# Patient Record
Sex: Female | Born: 1995 | Race: White | Hispanic: No | Marital: Single | Smoking: Former smoker
Health system: Southern US, Community
[De-identification: ages and names within clinical notes are randomized; demographics above are authoritative.]

## PROBLEM LIST (undated history)

## (undated) ENCOUNTER — Inpatient Hospital Stay: Payer: Self-pay

## (undated) ENCOUNTER — Inpatient Hospital Stay (HOSPITAL_COMMUNITY): Payer: Self-pay

## (undated) DIAGNOSIS — R51 Headache: Secondary | ICD-10-CM

## (undated) DIAGNOSIS — L509 Urticaria, unspecified: Secondary | ICD-10-CM

## (undated) DIAGNOSIS — M1990 Unspecified osteoarthritis, unspecified site: Secondary | ICD-10-CM

## (undated) DIAGNOSIS — F419 Anxiety disorder, unspecified: Secondary | ICD-10-CM

## (undated) DIAGNOSIS — N83209 Unspecified ovarian cyst, unspecified side: Secondary | ICD-10-CM

## (undated) DIAGNOSIS — D649 Anemia, unspecified: Secondary | ICD-10-CM

## (undated) DIAGNOSIS — N809 Endometriosis, unspecified: Secondary | ICD-10-CM

## (undated) DIAGNOSIS — J45909 Unspecified asthma, uncomplicated: Secondary | ICD-10-CM

## (undated) DIAGNOSIS — M419 Scoliosis, unspecified: Secondary | ICD-10-CM

## (undated) HISTORY — PX: TONSILLECTOMY: SUR1361

## (undated) HISTORY — PX: WRIST SURGERY: SHX841

## (undated) HISTORY — PX: OVARIAN CYST REMOVAL: SHX89

---
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
## 2016-01-26 LAB — OB RESULTS CONSOLE GBS: STREP GROUP B AG: POSITIVE

## 2016-02-16 ENCOUNTER — Encounter: Payer: Self-pay | Admitting: *Deleted

## 2016-02-16 ENCOUNTER — Observation Stay
Admission: EM | Admit: 2016-02-16 | Discharge: 2016-02-16 | Disposition: A | Discharge status: Home / Self Care | Payer: Medicaid Other | Attending: Obstetrics and Gynecology | Admitting: Obstetrics and Gynecology

## 2016-02-16 DIAGNOSIS — Z3A39 39 weeks gestation of pregnancy: Secondary | ICD-10-CM | POA: Insufficient documentation

## 2016-02-16 DIAGNOSIS — O471 False labor at or after 37 completed weeks of gestation: Secondary | ICD-10-CM | POA: Diagnosis not present

## 2016-02-16 MED ORDER — FLUCONAZOLE 150 MG PO TABS: 150.0000 mg | ORAL_TABLET | Freq: Every day | ORAL | 0 refills | Status: DC

## 2016-02-16 NOTE — Discharge Summary (Signed)
Pregular rate and rhythmhysician Discharge Summary  Patient ID: Peggy Chandler MRN: [id number] DOB/AGE: 19/06/1996 19 y.o.  Admit date: 02/16/2016 Discharge date: 02/16/2016  Admission Diagnoses: Pt is at 39 weeks with EDD of 02/23/16. She is here with c/o leakage of fluid. She admits positive fetal movement and braxton hicks contractions. She denies VB.   Discharge Diagnoses:  Active Problems:   * No active hospital problems. * No cervical dilation, Membranes intact, Reactive NST, Yeast infection  Discharged Condition: good  Hospital Course: Pt admitted and placed on monitor. Sterile speculum exam for pooling, nitrazine, ferning  Consults: None  Significant Diagnostic Studies: none  Treatments: none  Discharge Exam: Blood pressure 128/82, pulse 70, temperature 98.1 F (36.7 C), temperature source Oral, resp. rate 16, height  (1.702 m), weight 64.4 kg (142 lb), last menstrual period 05/13/2015. General appearance: alert, cooperative, appears stated age and no distress Resp: clear to auscultation bilaterally Cardio: regular rate and rhythm Cervix: closed  Nitrazine negative, Pooling negative, Ferning negative Toco: occasional Fetal Well Being: 125 bpm, moderate variability, +accelerations, -decelerations  Disposition: 01-Home or Self Care  Discharge Instructions    Discharge activity:  No Restrictions    Complete by:  As directed   Discharge diet:  No restrictions    Complete by:  As directed   Fetal Kick Count:  Lie on our left side for one hour after a meal, and count the number of times your baby kicks.  If it is less than 5 times, get up, move around and drink some juice.  Repeat the test 30 minutes later.  If it is still less than 5 kicks in an hour, notify your doctor.    Complete by:  As directed   LABOR:  When conractions begin, you should start to time them from the beginning of one contraction to the beginning  of the next.  When contractions are 5 - 10  minutes apart or less and have been regular for at least an hour, you should call your health care provider.    Complete by:  As directed   No sexual activity restrictions    Complete by:  As directed   Notify physician for bleeding from the vagina    Complete by:  As directed   Notify physician for blurring of vision or spots before the eyes    Complete by:  As directed   Notify physician for chills or fever    Complete by:  As directed   Notify physician for fainting spells, "black outs" or loss of consciousness    Complete by:  As directed   Notify physician for increase in vaginal discharge    Complete by:  As directed   Notify physician for leaking of fluid    Complete by:  As directed   Notify physician for pain or burning when urinating    Complete by:  As directed   Notify physician for pelvic pressure (sudden increase)    Complete by:  As directed   Notify physician for severe or continued nausea or vomiting    Complete by:  As directed   Notify physician for sudden gushing of fluid from the vagina (with or without continued leaking)    Complete by:  As directed   Notify physician for sudden, constant, or occasional abdominal pain    Complete by:  As directed   Notify physician if baby moving less than usual    Complete by:  As directed  Medication List    STOP taking these medications   loratadine 10 MG tablet Commonly known as:  CLARITIN   nitrofurantoin 100 MG capsule Commonly known as:  MACRODANTIN   norgestimate-ethinyl estradiol 0.25-35 MG-MCG tablet Commonly known as:  ORTHO-CYCLEN (28)   predniSONE 10 MG tablet Commonly known as:  DELTASONE   ranitidine 150 MG tablet Commonly known as:  ZANTAC   traMADol 50 MG tablet Commonly known as:  ULTRAM     TAKE these medications   fluconazole 150 MG tablet Commonly known as:  DIFLUCAN Take 1 tablet (150 mg total) by mouth daily.   Prenatal Vitamins 0.8 MG tablet Take 1 tablet by mouth daily.         SignedTresea Mall, CNM

## 2016-02-16 NOTE — Discharge Instructions (Signed)
Come back if:  Big gush of fluids Temp over 100.4 Decreased fetal movement Heavy vaginal bleeding Contractions every 3-5 min lasting at least one hour  Get plenty of rest and drink plenty of fluids!

## 2016-02-21 ENCOUNTER — Inpatient Hospital Stay
Admission: EM | Admit: 2016-02-21 | Discharge: 2016-02-24 | DRG: 775 | Disposition: A | Discharge status: Home / Self Care | Payer: Medicaid Other | Attending: Obstetrics and Gynecology | Admitting: Obstetrics and Gynecology

## 2016-02-21 DIAGNOSIS — D62 Acute posthemorrhagic anemia: Secondary | ICD-10-CM | POA: Diagnosis not present

## 2016-02-21 DIAGNOSIS — O99333 Smoking (tobacco) complicating pregnancy, third trimester: Secondary | ICD-10-CM | POA: Diagnosis present

## 2016-02-21 DIAGNOSIS — D696 Thrombocytopenia, unspecified: Secondary | ICD-10-CM | POA: Diagnosis present

## 2016-02-21 DIAGNOSIS — Z3A39 39 weeks gestation of pregnancy: Secondary | ICD-10-CM | POA: Diagnosis not present

## 2016-02-21 DIAGNOSIS — O9912 Other diseases of the blood and blood-forming organs and certain disorders involving the immune mechanism complicating childbirth: Secondary | ICD-10-CM | POA: Diagnosis present

## 2016-02-21 DIAGNOSIS — O99824 Streptococcus B carrier state complicating childbirth: Secondary | ICD-10-CM | POA: Diagnosis present

## 2016-02-21 DIAGNOSIS — O26893 Other specified pregnancy related conditions, third trimester: Secondary | ICD-10-CM | POA: Diagnosis present

## 2016-02-21 DIAGNOSIS — Z87891 Personal history of nicotine dependence: Secondary | ICD-10-CM

## 2016-02-21 DIAGNOSIS — Z6711 Type A blood, Rh negative: Secondary | ICD-10-CM

## 2016-02-21 DIAGNOSIS — O9902 Anemia complicating childbirth: Secondary | ICD-10-CM | POA: Diagnosis not present

## 2016-02-21 LAB — COMPREHENSIVE METABOLIC PANEL
ALK PHOS: 180 U/L — AB (ref 38–126)
ALT: 9 U/L — ABNORMAL LOW (ref 14–54)
ANION GAP: 10 (ref 5–15)
AST: 24 U/L (ref 15–41)
Albumin: 3.5 g/dL (ref 3.5–5.0)
BUN: 15 mg/dL (ref 6–20)
CALCIUM: 9.1 mg/dL (ref 8.9–10.3)
CO2: 18 mmol/L — AB (ref 22–32)
Chloride: 107 mmol/L (ref 101–111)
Creatinine, Ser: 1.04 mg/dL — ABNORMAL HIGH (ref 0.44–1.00)
Glucose, Bld: 89 mg/dL (ref 65–99)
Potassium: 3.4 mmol/L — ABNORMAL LOW (ref 3.5–5.1)
SODIUM: 135 mmol/L (ref 135–145)
Total Bilirubin: 0.4 mg/dL (ref 0.3–1.2)
Total Protein: 7.4 g/dL (ref 6.5–8.1)

## 2016-02-21 LAB — CBC
HEMATOCRIT: 34.1 % — AB (ref 35.0–47.0)
Hemoglobin: 12 g/dL (ref 12.0–16.0)
MCH: 30.8 pg (ref 26.0–34.0)
MCHC: 35.2 g/dL (ref 32.0–36.0)
MCV: 87.5 fL (ref 80.0–100.0)
PLATELETS: 157 10*3/uL (ref 150–440)
RBC: 3.9 MIL/uL (ref 3.80–5.20)
RDW: 12.7 % (ref 11.5–14.5)
WBC: 18 10*3/uL — AB (ref 3.6–11.0)

## 2016-02-21 LAB — CHLAMYDIA/NGC RT PCR (ARMC ONLY)
CHLAMYDIA TR: NOT DETECTED
N GONORRHOEAE: NOT DETECTED

## 2016-02-21 LAB — TYPE AND SCREEN
ABO/RH(D): A NEG
ANTIBODY SCREEN: NEGATIVE

## 2016-02-21 LAB — OB RESULTS CONSOLE GC/CHLAMYDIA
CHLAMYDIA, DNA PROBE: NEGATIVE
Gonorrhea: NEGATIVE

## 2016-02-21 LAB — PROTEIN / CREATININE RATIO, URINE
CREATININE, URINE: 162 mg/dL
PROTEIN CREATININE RATIO: 0.14 mg/mg{creat} (ref 0.00–0.15)
Total Protein, Urine: 22 mg/dL

## 2016-02-21 MED ORDER — SOD CITRATE-CITRIC ACID 500-334 MG/5ML PO SOLN: 30.0000 mL | ORAL | Status: DC | PRN

## 2016-02-21 MED ORDER — MISOPROSTOL 200 MCG PO TABS
ORAL_TABLET | ORAL | Status: AC
  Filled 2016-02-21: qty 4

## 2016-02-21 MED ORDER — LIDOCAINE HCL (PF) 1 % IJ SOLN
INTRAMUSCULAR | Status: AC
  Filled 2016-02-21: qty 30

## 2016-02-21 MED ORDER — DEXTROSE 5 % IV SOLN
2.5000 10*6.[IU] | INTRAVENOUS | Status: DC
  Filled 2016-02-21 (×4): qty 2.5

## 2016-02-21 MED ORDER — LACTATED RINGERS IV SOLN: 500.0000 mL | INTRAVENOUS | Status: DC | PRN

## 2016-02-21 MED ORDER — ONDANSETRON HCL 4 MG/2ML IJ SOLN: 4.0000 mg | Freq: Four times a day (QID) | INTRAMUSCULAR | Status: DC | PRN

## 2016-02-21 MED ORDER — OXYTOCIN 40 UNITS IN LACTATED RINGERS INFUSION - SIMPLE MED
2.5000 [IU]/h | INTRAVENOUS | Status: DC
  Administered 2016-02-21: 2.5 [IU]/h via INTRAVENOUS
  Filled 2016-02-21: qty 1000

## 2016-02-21 MED ORDER — LACTATED RINGERS IV SOLN
INTRAVENOUS | Status: DC
  Administered 2016-02-21: 22:00:00 via INTRAVENOUS

## 2016-02-21 MED ORDER — LIDOCAINE HCL (PF) 1 % IJ SOLN: 30.0000 mL | INTRAMUSCULAR | Status: DC | PRN

## 2016-02-21 MED ORDER — PENICILLIN G POTASSIUM 5000000 UNITS IJ SOLR
5.0000 10*6.[IU] | Freq: Once | INTRAVENOUS | Status: DC
  Filled 2016-02-21: qty 5

## 2016-02-21 MED ORDER — OXYTOCIN BOLUS FROM INFUSION: 500.0000 mL | Freq: Once | INTRAVENOUS | Status: DC

## 2016-02-21 MED ORDER — AMMONIA AROMATIC IN INHA
RESPIRATORY_TRACT | Status: AC
  Filled 2016-02-21: qty 10

## 2016-02-21 MED ORDER — OXYTOCIN 10 UNIT/ML IJ SOLN
INTRAMUSCULAR | Status: AC
  Filled 2016-02-21: qty 2

## 2016-02-21 MED ORDER — OXYTOCIN 10 UNIT/ML IJ SOLN: 10.0000 [IU] | Freq: Once | INTRAMUSCULAR | Status: DC

## 2016-02-21 NOTE — H&P (Signed)
OB History & Physical   History of Present Illness:  Chief Complaint: contractions  HPI:  Peggy Chandler is a 20 y.o. G44P0020 female at [redacted]w[redacted]d dated by 7 week ultrasound.  Her pregnancy has been complicated by Rh negative status, tobacco abuse (quit smoking in July 2017), thrombocytopenia, psychiatric admission during pregnancy (at [facility] in 11/2015 for anxiet/OCD/agoraphobia/), chlamydia at new ob visit..    She reports contractions.   She denies leakage of fluid.   She denies vaginal bleeding.   She reports fetal movement.    Maternal Medical History:   Past Medical History:  Diagnosis Date  . Anemia   . Anxiety   . Arthritis   . Asthma   . Endometriosis   . Headache(784.0)   . Hives   . Ovarian cyst   . Scoliosis     Past Surgical History:  Procedure Laterality Date  . OVARIAN CYST REMOVAL    . TONSILLECTOMY      Allergies  Allergen Reactions  . Asa [Aspirin] Other (See Comments)    Syncope   . Asa [Aspirin] Hives    Prior to Admission medications   Medication Sig Start Date End Date Taking? Authorizing Provider  Prenatal Multivit-Min-Fe-FA (PRENATAL VITAMINS) 0.8 MG tablet Take 1 tablet by mouth daily. 06/14/15  Yes Sharman Cheek, MD  fluconazole (DIFLUCAN) 150 MG tablet Take 1 tablet (150 mg total) by mouth daily. Patient not taking: Reported on 02/21/2016 02/16/16   Tresea Mall, CNM    OB History  Gravida Para Term Preterm AB Living  3 0 0 0 2    SAB TAB Ectopic Multiple Live Births  1 1 0        # Outcome Date GA Lbr Len/2nd Weight Sex Delivery Anes PTL Lv  3 Current           2 SAB           1 TAB              Birth Comments: System Generated. Please review and update pregnancy details.      Prenatal care site: [facility]  Social History: She  reports that she quit smoking about 2 months ago. Her smoking use included Cigarettes. She smoked 1.00 pack per day. She has never used smokeless tobacco. She reports that she does not drink  alcohol or use drugs.  Family History: family history includes Anxiety disorder in her mother; Bipolar disorder in her sister.   Review of Systems: Negative x 10 systems reviewed except as noted in the HPI.    Physical Exam:  Vital Signs: BP (!) 134/91 (BP Location: Left Arm)   Pulse 73   Temp 98.1 F (36.7 C) (Oral)   Resp 18   Ht 5\' 7"  (1.702 m)   Wt 64.4 kg (142 lb)   LMP 05/13/2015   BMI 22.24 kg/m  General: no acute distress.  HEENT: normocephalic, atraumatic Heart: regular rate & rhythm.  No murmurs/rubs/gallops Lungs: clear to auscultation bilaterally Abdomen: soft, gravid, non-tender;  EFW 6.5 pounds Pelvic: (female chaperone present during pelvic exam)  External: Normal external female genitalia  Cervix: 6cm at admission (3.5cm 2 hours prior)  Extremities: non-tender, symmetric, no edema bilaterally.  DTRs: 2+  Neurologic: Alert & oriented x 3.    Pertinent Results:  Prenatal Labs: Blood type/Rh A negative  Antibody screen negative  Rubella Immune  Varicella Immune    RPR NR  HBsAg negative  HIV negative  GC negative  Chlamydia Positive (  treated with negative TOC at 36 weeks)  Genetic screening First trim neg, msAFP neg  1 hour GTT Unable to find  3 hour GTT n/a  GBS positive   Baseline FHR: 125 beats/min   Variability: moderate   Accelerations: present Decelerations: absent Contractions: present frequency: 3-4 q 10 min Overall assessment: category 1  Assessment:  Peggy Chandler is a 20 y.o. G33P0020 female at [redacted]w[redacted]d with active labor.   Plan:  1. Admit to Labor & Delivery  2. CBC, T&S, Clrs, IVF 3. GBS positive.   4. Fetwal well-being: reassuring   Conard Novak, MD 02/21/2016 11:13 PM

## 2016-02-21 NOTE — OB Triage Note (Signed)
Patient came in for observation for labor evaluation. Patient reports contractions every four to five minutes that started this morning and have progressively gotten closer together and more intense. Patient rates pain 8 out of 10. Patient denies leaking of fluid but complains of Sharmon Cheramie discharge from a untreated yeast infection. Patinet denies vaginal bleeding and spotting. Patient has elevated blood pressures 140s/90s, but patient afebrile. FHR baseline 125 with moderate variability with accelerations 15 x 15 and no decelerations. Significant other at bedside. Will continue to monitor.

## 2016-02-22 LAB — FETAL SCREEN: FETAL SCREEN: NEGATIVE

## 2016-02-22 LAB — CBC
HEMATOCRIT: 28.9 % — AB (ref 35.0–47.0)
HEMOGLOBIN: 10.4 g/dL — AB (ref 12.0–16.0)
MCH: 31.3 pg (ref 26.0–34.0)
MCHC: 35.9 g/dL (ref 32.0–36.0)
MCV: 87.2 fL (ref 80.0–100.0)
Platelets: 113 10*3/uL — ABNORMAL LOW (ref 150–440)
RBC: 3.31 MIL/uL — AB (ref 3.80–5.20)
RDW: 13 % (ref 11.5–14.5)
WBC: 20.2 10*3/uL — ABNORMAL HIGH (ref 3.6–11.0)

## 2016-02-22 MED ORDER — ONDANSETRON HCL 4 MG PO TABS: 4.0000 mg | ORAL_TABLET | ORAL | Status: DC | PRN

## 2016-02-22 MED ORDER — WITCH HAZEL-GLYCERIN EX PADS: 1.0000 "application " | MEDICATED_PAD | CUTANEOUS | Status: DC | PRN

## 2016-02-22 MED ORDER — DIBUCAINE 1 % RE OINT: 1.0000 "application " | TOPICAL_OINTMENT | RECTAL | Status: DC | PRN

## 2016-02-22 MED ORDER — HYDROCODONE-ACETAMINOPHEN 5-325 MG PO TABS: 1.0000 | ORAL_TABLET | Freq: Four times a day (QID) | ORAL | Status: DC | PRN

## 2016-02-22 MED ORDER — PRENATAL MULTIVITAMIN CH
1.0000 | ORAL_TABLET | Freq: Every day | ORAL | Status: DC
  Administered 2016-02-22 – 2016-02-24 (×3): 1 via ORAL
  Filled 2016-02-22 (×3): qty 1

## 2016-02-22 MED ORDER — COCONUT OIL OIL: 1.0000 "application " | TOPICAL_OIL | Status: DC | PRN

## 2016-02-22 MED ORDER — DIPHENHYDRAMINE HCL 25 MG PO CAPS: 25.0000 mg | ORAL_CAPSULE | Freq: Four times a day (QID) | ORAL | Status: DC | PRN

## 2016-02-22 MED ORDER — HYDROCODONE-ACETAMINOPHEN 5-325 MG PO TABS: 2.0000 | ORAL_TABLET | Freq: Four times a day (QID) | ORAL | Status: DC | PRN

## 2016-02-22 MED ORDER — FERROUS SULFATE 325 (65 FE) MG PO TABS
325.0000 mg | ORAL_TABLET | Freq: Two times a day (BID) | ORAL | Status: DC
  Administered 2016-02-22 – 2016-02-24 (×5): 325 mg via ORAL
  Filled 2016-02-22 (×5): qty 1

## 2016-02-22 MED ORDER — ACETAMINOPHEN 325 MG PO TABS: 650.0000 mg | ORAL_TABLET | ORAL | Status: DC | PRN

## 2016-02-22 MED ORDER — SENNOSIDES-DOCUSATE SODIUM 8.6-50 MG PO TABS
2.0000 | ORAL_TABLET | ORAL | Status: DC
  Administered 2016-02-23: 2 via ORAL
  Filled 2016-02-22 (×2): qty 2

## 2016-02-22 MED ORDER — RHO D IMMUNE GLOBULIN 1500 UNIT/2ML IJ SOSY
300.0000 ug | PREFILLED_SYRINGE | Freq: Once | INTRAMUSCULAR | Status: AC
  Administered 2016-02-22: 300 ug via INTRAVENOUS
  Filled 2016-02-22: qty 2

## 2016-02-22 MED ORDER — BENZOCAINE-MENTHOL 20-0.5 % EX AERO: 1.0000 "application " | INHALATION_SPRAY | CUTANEOUS | Status: DC | PRN

## 2016-02-22 MED ORDER — SIMETHICONE 80 MG PO CHEW: 80.0000 mg | CHEWABLE_TABLET | ORAL | Status: DC | PRN

## 2016-02-22 MED ORDER — ONDANSETRON HCL 4 MG/2ML IJ SOLN: 4.0000 mg | INTRAMUSCULAR | Status: DC | PRN

## 2016-02-22 NOTE — Discharge Summary (Signed)
OB Discharge Summary     Patient Name: Peggy Chandler DOB: [birth year] MRN: [id number]  Date of admission: 02/21/2016 Delivering MD: Conard Novak, MD  Date of Delivery: 02/22/2016  Date of discharge: 02/24/2016  Admitting diagnosis: 39 weeks contractions Intrauterine pregnancy: [redacted]w[redacted]d     Secondary diagnosis: None     Discharge diagnosis: Term Pregnancy Delivered                                                                                                Post partum procedures:rhogam  Augmentation: none  Complications: None  Hospital course:  Onset of Labor With Vaginal Delivery     20 y.o. yo G3P0020 at [redacted]w[redacted]d was admitted in Active Labor on 02/21/2016. Patient had an uncomplicated labor course as follows:  Membrane Rupture Time/Date: 11:37 PM ,02/21/2016   Intrapartum Procedures: Episiotomy: None [1]                                         Lacerations:  None [1]  Patient had a delivery of a Viable 6#10.2oz female infant. 02/21/2016  Information for the patient's newborn:  Mkayla, Steele [[id number]]  Delivery Method: Vag-Spont    Pateint had an uncomplicated postpartum course.  She is ambulating, tolerating a regular diet, passing flatus, and urinating well. Patient is discharged home in stable condition on 02/24/2016.    Physical exam  Vitals:   02/23/16 1640 02/23/16 1943 02/24/16 0740 02/24/16 1111  BP: 113/73 115/75 117/68   Pulse: 64 69 66   Resp: Temp: 98.5 F (36.9 C) 98.3 F (36.8 C) 98.6 F (37 C) 98.2 F (36.8 C)  TempSrc: Oral Oral Oral Oral  SpO2: 100% 100% 99%   Weight:      Height:       General: alert, cooperative and no distress Lochia: appropriate Uterine Fundus: firm at U-1/ML/NT DVT Evaluation: No evidence of DVT seen on physical exam. Breasts: soft  Labs: Lab Results  Component Value Date   WBC 20.2 (H) 02/22/2016   HGB 10.4 (L) 02/22/2016   HCT 28.9 (L) 02/22/2016   MCV 87.2 02/22/2016   PLT 113 (L) 02/22/2016    CMP Latest Ref Rng & Units 02/21/2016  Glucose 65 - 99 mg/dL 89  BUN 6 - 20 mg/dL 15  Creatinine 0.86 - 5.78 mg/dL 4.69(G)  Sodium 295 - 284 mmol/L 135  Potassium 3.5 - 5.1 mmol/L 3.4(L)  Chloride 101 - 111 mmol/L 107  CO2 22 - 32 mmol/L 18(L)  Calcium 8.9 - 10.3 mg/dL 9.1  Total Protein 6.5 - 8.1 g/dL 7.4  Total Bilirubin 0.3 - 1.2 mg/dL 0.4  Alkaline Phos 38 - 126 U/L 180(H)  AST 15 - 41 U/L 24  ALT 14 - 54 U/L 9(L)    Discharge instruction: per After Visit Summary.  Medications:    Medication List    STOP taking these medications   fluconazole 150 MG tablet Commonly known as:  DIFLUCAN  TAKE these medications   Ferrous Sulfate 142 (45 Fe) MG Tbcr Commonly known as:  SLOW FE Take 1 capsule by mouth daily.   Prenatal Vitamins 0.8 MG tablet Take 1 tablet by mouth daily.      Diet: routine diet  Activity: Advance as tolerated. Pelvic rest for 6 weeks.   Outpatient follow up: Follow-up Information    Conard NovakJackson, Stephen D, MD. Schedule an appointment as soon as possible for a visit in 2 week(s).   Specialty:  Obstetrics and Gynecology Why:  pp depression check in 1-2 weeks; please call and make this appointment Also schedule postpartum appointment in 6 weeks. Contact information: [address] [place] [address] [phone]             Postpartum contraception: IUD, unsure which one Rhogam Given postpartum: yes Rubella vaccine given postpartum: no Varicella vaccine given postpartum: no TDaP given antepartum or postpartum: AP  Newborn Data: Live born female/ [place] Birth Weight: 6 lb 10.2 oz (3010 g) APGAR: 8, 9  Baby Feeding: Bottle  Disposition:home with mother  SIGNED: Farrel ConnersGUTIERREZ, Rozina Pointer, CNM

## 2016-02-22 NOTE — Progress Notes (Signed)
Patient ID: Peggy Chandler, female   DOB: [birth year], 20 y.o.   MRN: [id number] Obstetric Postpartum Daily Progress Note Subjective:  20 y.o. [id number] postpartum day #1 status post vaginal delivery.  She is ambulating, is tolerating po, is voiding spontaneously.  Her pain is well controlled on PO pain medications. Her lochia is less than menses.   Medications SCHEDULED MEDICATIONS  . ferrous sulfate  325 mg Oral BID WC  . prenatal multivitamin  1 tablet Oral Q1200  . [START ON 02/23/2016] senna-docusate  2 tablet Oral Q24H    MEDICATION INFUSIONS     PRN MEDICATIONS  acetaminophen, benzocaine-Menthol, coconut oil, witch hazel-glycerin **AND** dibucaine, diphenhydrAMINE, HYDROcodone-acetaminophen **OR** HYDROcodone-acetaminophen, ondansetron **OR** ondansetron (ZOFRAN) IV, simethicone    Objective:   Vitals:   02/22/16 0152 02/22/16 0211 02/22/16 0724 02/22/16 1119  BP: (!) 148/85 127/73 125/74 107/79  Pulse: 91 (!) 58 67 68  Resp:  18 18 18   Temp:  98.7 F (37.1 C) 98.4 F (36.9 C) 98.7 F (37.1 C)  TempSrc:  Oral Oral Oral  SpO2:   99%   Weight:      Height:        Current Vital Signs 24h Vital Sign Ranges  T 98.7 F (37.1 C) Temp  Avg: 98.4 F (36.9 C)  Min: 97.8 F (36.6 C)  Max: 98.7 F (37.1 C)  BP 107/79 BP  Min: 107/79  Max: 148/85  HR 68 Pulse  Avg: 76.1  Min: 56  Max: 133  RR 18 Resp  Avg: 18  Min: 18  Max: 18  SaO2 99 % Not Delivered SpO2  Avg: 99 %  Min: 99 %  Max: 99 %       24 Hour I/O Current Shift I/O  Time Ins Outs 08/05 0701 - 08/06 0700 In: -  Out: 450  08/06 0701 - 08/06 1900 In: -  Out: 600 [Urine:600]  General: NAD Pulmonary: no increased work of breathing Abdomen: non-distended, non-tender, fundus firm at level of umbilicus Extremities: no edema, no erythema, no tenderness  Labs:   Recent Labs Lab 02/21/16 2203 02/22/16 0536  WBC 18.0* 20.2*  HGB 12.0 10.4*  HCT 34.1* 28.9*  PLT 157 113*     Assessment:   20 y.o.  [id number] postpartum day # 1 status post SVD, doing well  Plan:   1) Acute blood loss anemia - hemodynamically stable and asymptomatic - po ferrous sulfate  2) A NEG / Rubella Immune (12/20 0000)/ Varicella Immune  3) TDAP status Unknown  4) bottle feeding /Contraception = undecided  5) Disposition: home PPD#2.   Thomasene MohairStephen Jackson, MD 02/22/2016 12:03 PM

## 2016-02-23 LAB — RHOGAM INJECTION: UNIT DIVISION: 0

## 2016-02-23 LAB — RPR: RPR Ser Ql: NONREACTIVE

## 2016-02-23 NOTE — Progress Notes (Signed)
   02/23/16 0900  Clinical Encounter Type  Visited With Patient and family together  Visit Type Initial  Consult/Referral To Chaplain  Pt appears to be in good spirits.

## 2016-02-23 NOTE — Progress Notes (Addendum)
PP Note  Subjective: no complaints, up ad lib, voiding and tolerating PO  Objective: Blood pressure 120/88, pulse (!) 53, temperature 98 F (36.7 C), temperature source Oral, resp. rate 18, height 5\' 7"  (1.702 m), weight 142 lb (64.4 kg), last menstrual period 05/13/2015, SpO2 100 %,  Physical Exam:  General: alert and cooperative Lochia: appropriate Uterine Fundus: firm Incision: N/A DVT Evaluation: No evidence of DVT seen on physical exam. Abdomen: soft, NT   Recent Labs  02/21/16 2203 02/22/16 0536  HGB 12.0 10.4*  HCT 34.1* 28.9*    Assessment PPD 1 (24 hours at midnight this am), acute blood loss anemia  Plan: Discharge tomorrow, Continue PP care, Advance activity as tolerated and Fe replacement, anemia precautions  Baby needing 48 hours observation d/t inadequate GBS treatment  Feeding: bottle Contraception: undecided Blood Type: A neg/baby AB positive: Rhogam given RI/VI TDAP UTD    Peggy Chandler, Peggy Chandler, CNM 02/23/2016, 9:55 AM

## 2016-02-24 MED ORDER — FERROUS SULFATE ER 142 (45 FE) MG PO TBCR: 1.0000 | EXTENDED_RELEASE_TABLET | Freq: Every day | ORAL | Status: DC

## 2016-02-24 NOTE — Discharge Instructions (Signed)
Vaginal Delivery, Care After Refer to this sheet in the next few weeks. These discharge instructions provide you with information on caring for yourself after delivery. Your caregiver may also give you specific instructions. Your treatment has been planned according to the most current medical practices available, but problems sometimes occur. Call your caregiver if you have any problems or questions after you go home. HOME CARE INSTRUCTIONS 1. Take over-the-counter or prescription medicines only as directed by your caregiver or pharmacist. 2. Do not drink alcohol, especially if you are breastfeeding or taking medicine to relieve pain. 3. Do not smoke tobacco. 4. Continue to use good perineal care. Good perineal care includes: 1. Wiping your perineum from back to front 2. Keeping your perineum clean. 3. You can do sitz baths twice a day, to help keep this area clean 5. Do not use tampons, douche or have sex until your caregiver says it is okay. 6. Shower only and avoid sitting in submerged water, aside from sitz baths 7. Wear a well-fitting bra that provides breast support. 8. Eat healthy foods. 9. Drink enough fluids to keep your urine clear or pale yellow. 10. Eat high-fiber foods such as whole grain cereals and breads, brown rice, beans, and fresh fruits and vegetables every day. These foods may help prevent or relieve constipation. 11. Avoid constipation with high fiber foods or medications, such as miralax or metamucil 12. Follow your caregiver's recommendations regarding resumption of activities such as climbing stairs, driving, lifting, exercising, or traveling. 13. Talk to your caregiver about resuming sexual activities. Resumption of sexual activities is dependent upon your risk of infection, your rate of healing, and your comfort and desire to resume sexual activity. 14. Try to have someone help you with your household activities and your newborn for at least a few days after you leave  the hospital. 15. Rest as much as possible. Try to rest or take a nap when your newborn is sleeping. 16. Increase your activities gradually. 17. Keep all of your scheduled postpartum appointments. It is very important to keep your scheduled follow-up appointments. At these appointments, your caregiver will be checking to make sure that you are healing physically and emotionally. SEEK MEDICAL CARE IF:   You are passing large clots from your vagina. Save any clots to show your caregiver.  You have a foul smelling discharge from your vagina.  You have trouble urinating.  You are urinating frequently.  You have pain when you urinate.  You have a change in your bowel movements.  You have increasing redness, pain, or swelling near your vaginal incision (episiotomy) or vaginal tear.  You have pus draining from your episiotomy or vaginal tear.  Your episiotomy or vaginal tear is separating.  You have painful, hard, or reddened breasts.  You have a severe headache.  You have blurred vision or see spots.  You feel sad or depressed.  You have thoughts of hurting yourself or your newborn.  You have questions about your care, the care of your newborn, or medicines.  You are dizzy or light-headed.  You have a rash.  You have nausea or vomiting.  You were breastfeeding and have not had a menstrual period within 12 weeks after you stopped breastfeeding.  You are not breastfeeding and have not had a menstrual period by the 12th week after delivery.  You have a fever. SEEK IMMEDIATE MEDICAL CARE IF:   You have persistent pain.  You have chest pain.  You have shortness of breath.    You faint.  You have leg pain.  You have stomach pain.  Your vaginal bleeding saturates two or more sanitary pads in 1 hour. MAKE SURE YOU:   Understand these instructions.  Will watch your condition.  Will get help right away if you are not doing well or get worse. Document Released:  07/02/2000 Document Revised: 11/19/2013 Document Reviewed: 03/01/2012 [facility] Patient Information 2015 [place], [place]. This information is not intended to replace advice given to you by your health care provider. Make sure you discuss any questions you have with your health care provider.  Sitz Bath A sitz bath is a warm water bath taken in the sitting position. The water covers only the hips and butt (buttocks). We recommend using one that fits in the toilet, to help with ease of use and cleanliness. It may be used for either healing or cleaning purposes. Sitz baths are also used to relieve pain, itching, or muscle tightening (spasms). The water may contain medicine. Moist heat will help you heal and relax.  HOME CARE  Take 3 to 4 sitz baths a day. 18. Fill the bathtub half-full with warm water. 19. Sit in the water and open the drain a little. 20. Turn on the warm water to keep the tub half-full. Keep the water running constantly. 21. Soak in the water for 15 to 20 minutes. 22. After the sitz bath, pat the affected area dry. GET HELP RIGHT AWAY IF: You get worse instead of better. Stop the sitz baths if you get worse. MAKE SURE YOU:  Understand these instructions.  Will watch your condition.  Will get help right away if you are not doing well or get worse. Document Released: 08/12/2004 Document Revised: 03/29/2012 Document Reviewed: 11/02/2010 [facility] Patient Information 2015 [place], [place]. This information is not intended to replace advice given to you by your health care provider. Make sure you discuss any questions you have with your health care provider.  Call your doctor for increased pain or vaginal bleeding, temperature above 100.4, depression, or concerns.  No strenuous activity or heavy lifting for 6 weeks.  No intercourse, tampons, douching, or enemas for 6 weeks.  No tub baths-showers only.  No driving for 2 weeks or while taking pain medications.  Continue prenatal vitamin  and iron.

## 2016-02-24 NOTE — Progress Notes (Signed)
Prenatal records indicate that pt received TDaP in May, 2017. Reynold BowenSusan Paisley Wylee Dorantes, RN 02/24/2016 11:56 AM

## 2016-02-24 NOTE — Progress Notes (Signed)
Discharge instructions provided.  Pt and sig other verbalize understanding of all instructions and follow-up care.  Prescription given.  Pt discharged to home with infant at 1500 on 02/24/16 via wheelchair by volunteer. Reynold BowenSusan Paisley Khristen Cheyney, RN 02/24/2016 4:00 PM

## 2016-09-29 ENCOUNTER — Ambulatory Visit: Payer: Self-pay | Admitting: Obstetrics & Gynecology

## 2016-12-16 ENCOUNTER — Emergency Department: Payer: Self-pay

## 2016-12-16 ENCOUNTER — Encounter: Payer: Self-pay | Admitting: Emergency Medicine

## 2016-12-16 ENCOUNTER — Emergency Department
Admission: EM | Admit: 2016-12-16 | Discharge: 2016-12-16 | Disposition: A | Discharge status: Home / Self Care | Payer: Self-pay | Attending: Emergency Medicine | Admitting: Emergency Medicine

## 2016-12-16 DIAGNOSIS — F17210 Nicotine dependence, cigarettes, uncomplicated: Secondary | ICD-10-CM | POA: Insufficient documentation

## 2016-12-16 DIAGNOSIS — R05 Cough: Secondary | ICD-10-CM | POA: Insufficient documentation

## 2016-12-16 DIAGNOSIS — R059 Cough, unspecified: Secondary | ICD-10-CM

## 2016-12-16 DIAGNOSIS — J45909 Unspecified asthma, uncomplicated: Secondary | ICD-10-CM | POA: Insufficient documentation

## 2016-12-16 MED ORDER — PSEUDOEPH-BROMPHEN-DM 30-2-10 MG/5ML PO SYRP: 5.0000 mL | ORAL_SOLUTION | Freq: Four times a day (QID) | ORAL | 0 refills | Status: DC | PRN
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
## 2016-12-16 NOTE — ED Notes (Signed)
Pt discharged home after verbalizing understanding of discharge instructions; nad noted. 
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
## 2016-12-25 IMAGING — US US PELV - US TRANSVAGINAL
1 series · 13 of 25 positions shown · non-contrast
Comparison: Pelvic ultrasound 05/03/2013

CLINICAL DATA: Pelvic pain for 3-4 days. History of endometriosis.
History of left ovarian cyst removal 3100.

EXAM:
TRANSABDOMINAL AND TRANSVAGINAL ULTRASOUND OF PELVIS
TECHNIQUE: Both transabdominal and transvaginal ultrasound examinations of the
pelvis were performed. Transabdominal technique was performed for
global imaging of the pelvis including uterus, ovaries, adnexal
regions, and pelvic cul-de-sac. It was necessary to proceed with
endovaginal exam following the transabdominal exam to visualize the
uterus, endometrium and right ovary.

[Series 1: us pelv - us transvaginal · 0.20mm/px · 13 of 44 slices shown]
[im 1/44]
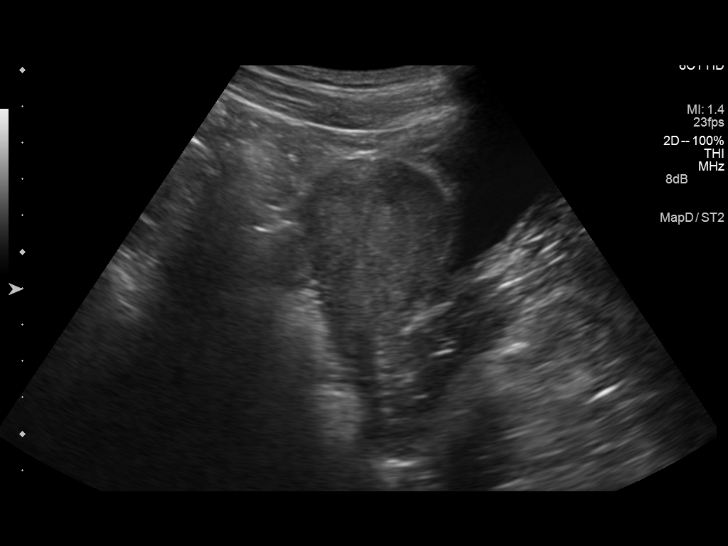
[im 4/44]
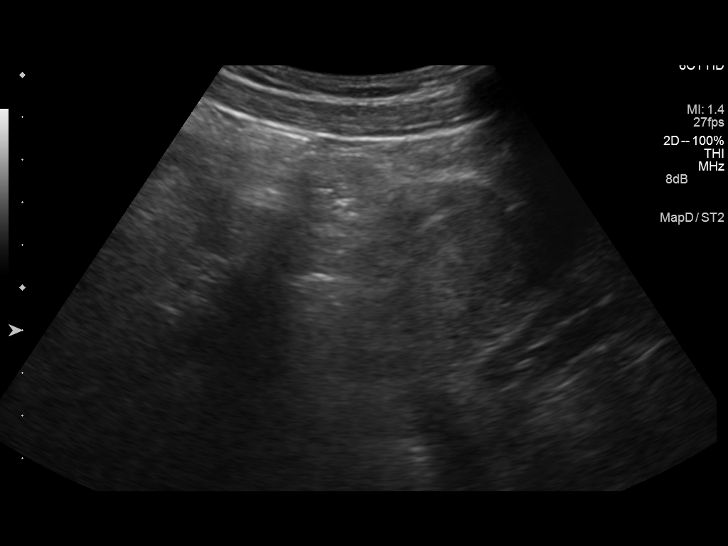
[im 8/44]
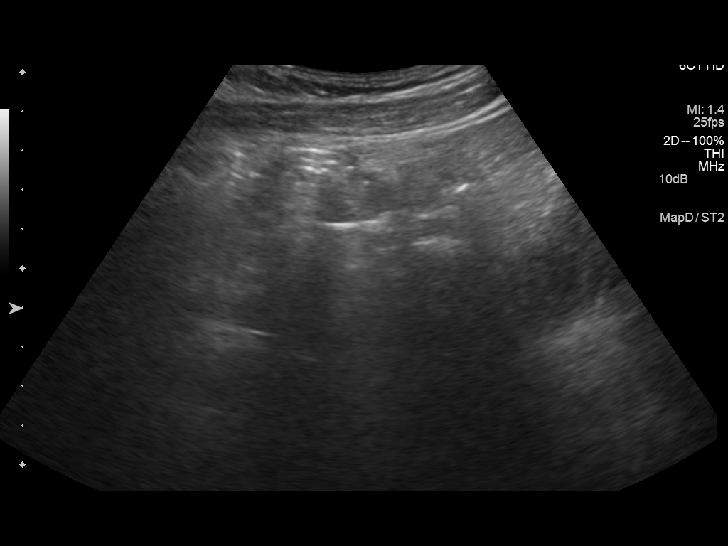
[im 11/44]
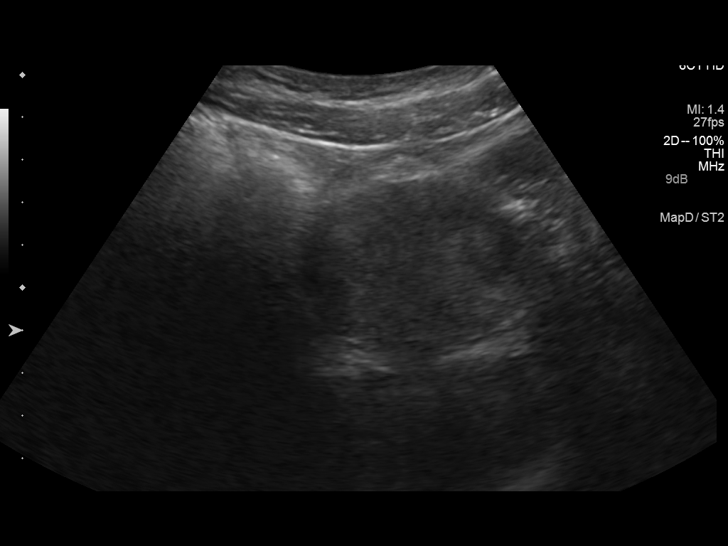
[im 15/44]
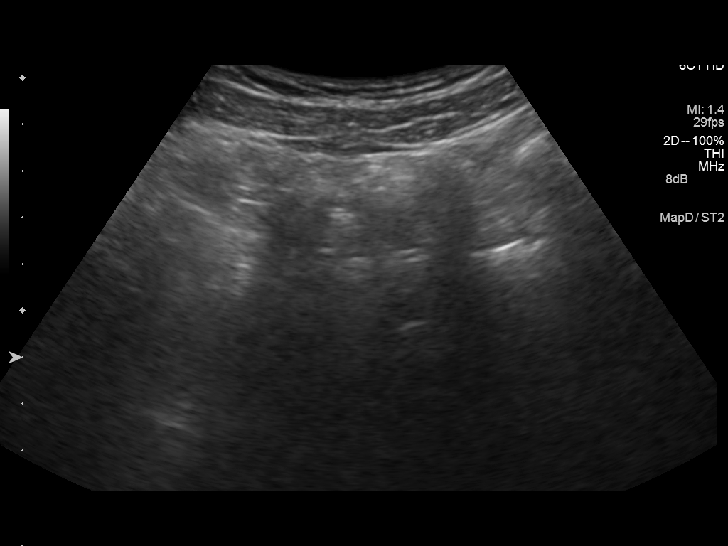
[im 18/44]
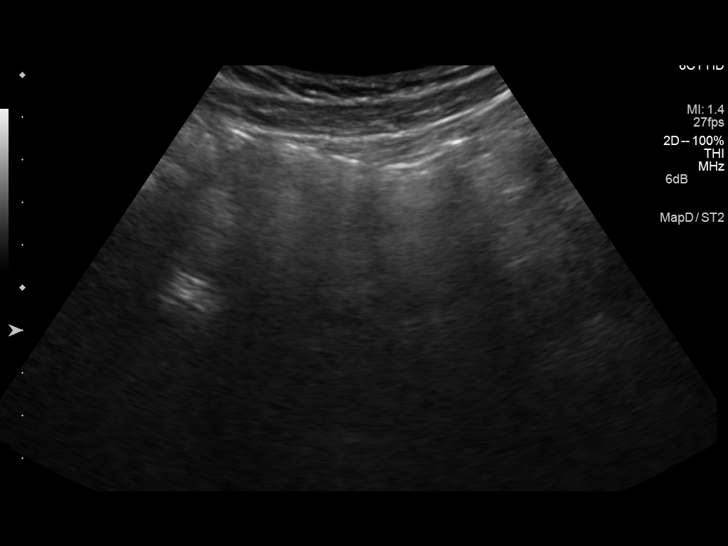
[im 22/44]
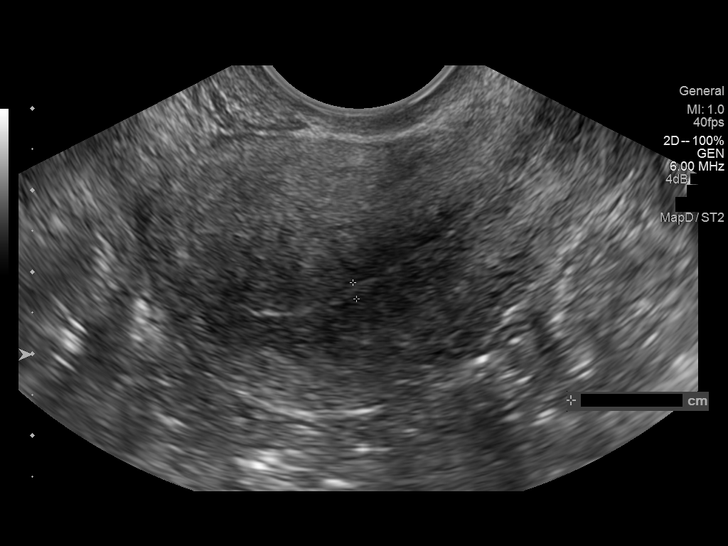
[im 26/44]
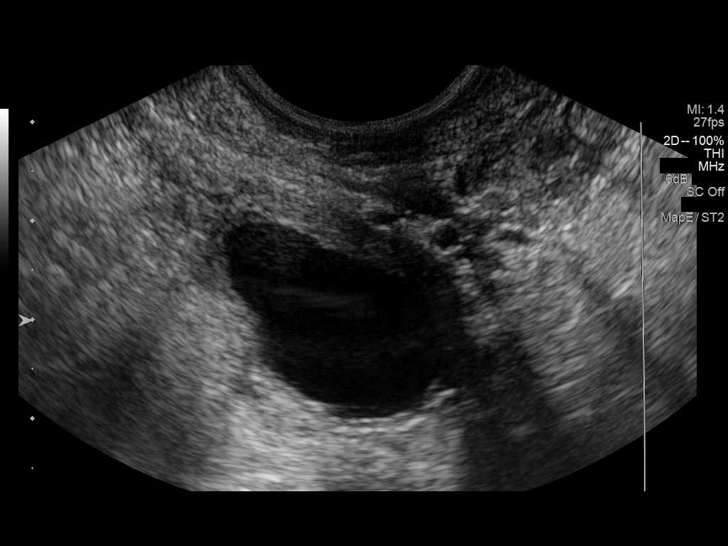
[im 29/44]
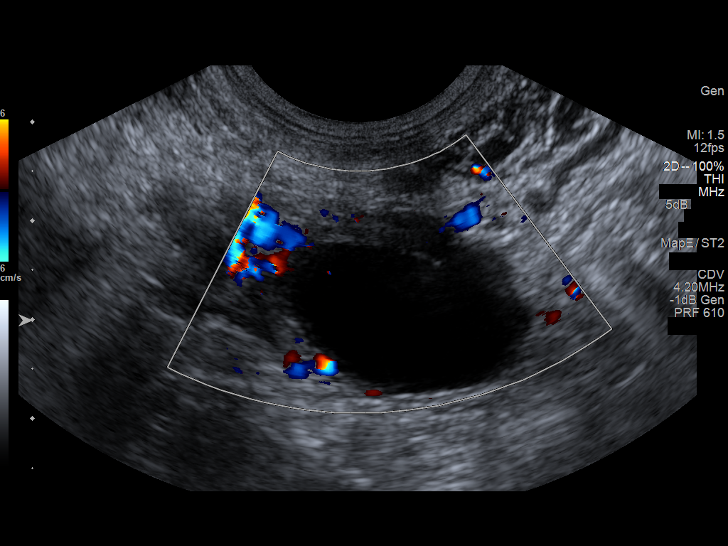
[im 33/44]
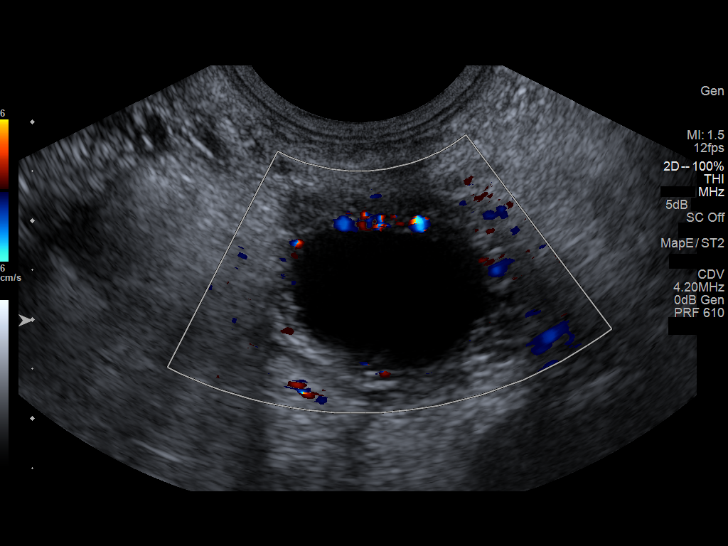
[im 36/44]
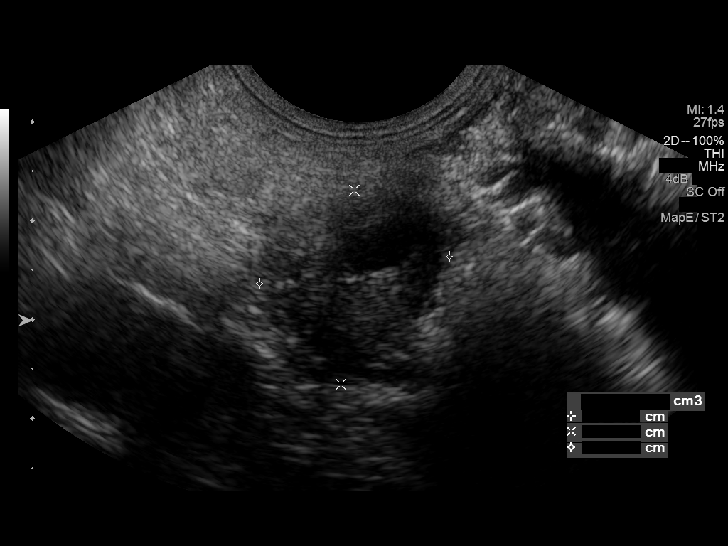
[im 40/44]
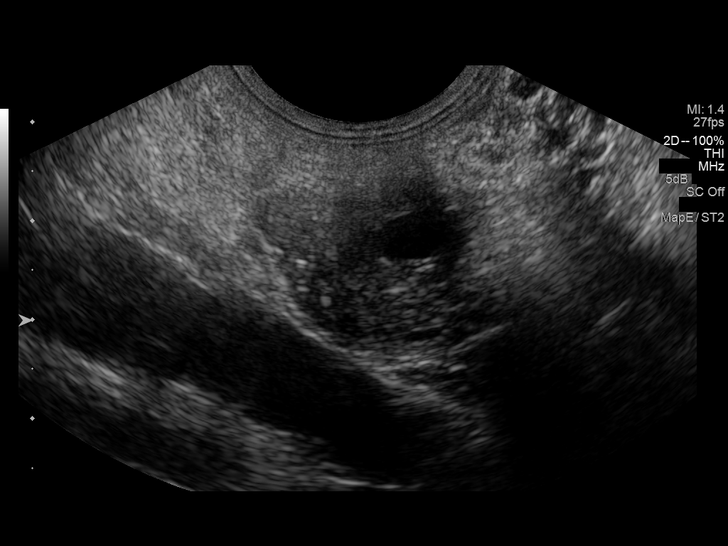
[im 44/44]
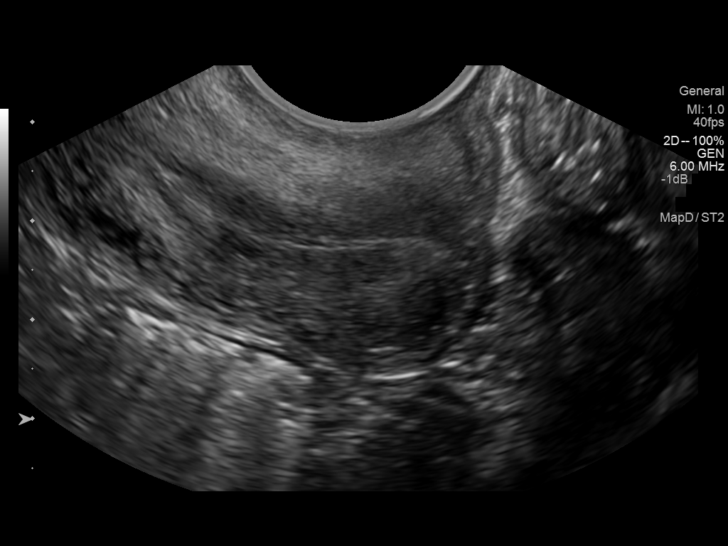

[13 of 25 positions shown; findings below may reference images not displayed]

FINDINGS: Uterus

Measurements: 6.4 x 4.3 x 4.0 cm. No fibroids or other mass
visualized.

Endometrium

Thickness: 2 mm.  No focal abnormality visualized.

Right ovary

Measurements: 2.4 x 2.0 x 1.9 cm. Normal appearance/no adnexal mass.
Normal blood flow.

Left ovary

Measurements: 3.0 x 2.3 x 2.6 cm. There is a simple cyst in the left
ovary measuring 2.5 x 1.5 x 2.2 cm. Normal blood flow seen to the
adjacent ovarian parenchyma.

Other findings

No free fluid.
IMPRESSION: 1. Simple cyst in the left ovary measures 2.5 cm. Normal blood flow
seen.
2. Normal appearance of right ovary and uterus.

## 2017-02-02 ENCOUNTER — Encounter: Payer: Self-pay | Admitting: Emergency Medicine

## 2017-02-02 ENCOUNTER — Emergency Department
Admission: EM | Admit: 2017-02-02 | Discharge: 2017-02-02 | Disposition: A | Discharge status: Home / Self Care | Payer: Medicaid Other | Attending: Emergency Medicine | Admitting: Emergency Medicine

## 2017-02-02 ENCOUNTER — Telehealth: Payer: Self-pay | Admitting: Emergency Medicine

## 2017-02-02 DIAGNOSIS — Z5321 Procedure and treatment not carried out due to patient leaving prior to being seen by health care provider: Secondary | ICD-10-CM | POA: Insufficient documentation

## 2017-02-02 DIAGNOSIS — R109 Unspecified abdominal pain: Secondary | ICD-10-CM | POA: Insufficient documentation

## 2017-02-02 LAB — COMPREHENSIVE METABOLIC PANEL
ALBUMIN: 4.7 g/dL (ref 3.5–5.0)
ALK PHOS: 58 U/L (ref 38–126)
ALT: 10 U/L — ABNORMAL LOW (ref 14–54)
ANION GAP: 9 (ref 5–15)
AST: 18 U/L (ref 15–41)
BILIRUBIN TOTAL: 0.5 mg/dL (ref 0.3–1.2)
BUN: 11 mg/dL (ref 6–20)
CALCIUM: 9.3 mg/dL (ref 8.9–10.3)
CO2: 24 mmol/L (ref 22–32)
Chloride: 108 mmol/L (ref 101–111)
Creatinine, Ser: 0.73 mg/dL (ref 0.44–1.00)
GFR calc Af Amer: >60 mL/min (ref >60)
GLUCOSE: 121 mg/dL — AB (ref 65–99)
Potassium: 3.6 mmol/L (ref 3.5–5.1)
Sodium: 141 mmol/L (ref 135–145)
TOTAL PROTEIN: 7.7 g/dL (ref 6.5–8.1)

## 2017-02-02 LAB — CBC
HCT: 33.4 % — ABNORMAL LOW (ref 35.0–47.0)
Hemoglobin: 10.8 g/dL — ABNORMAL LOW (ref 12.0–16.0)
MCH: 23.7 pg — ABNORMAL LOW (ref 26.0–34.0)
MCHC: 32.3 g/dL (ref 32.0–36.0)
MCV: 73.4 fL — ABNORMAL LOW (ref 80.0–100.0)
Platelets: 215 10*3/uL (ref 150–440)
RBC: 4.56 MIL/uL (ref 3.80–5.20)
RDW: 16.7 % — AB (ref 11.5–14.5)
WBC: 10.3 10*3/uL (ref 3.6–11.0)

## 2017-02-02 LAB — URINALYSIS, COMPLETE (UACMP) WITH MICROSCOPIC
BILIRUBIN URINE: NEGATIVE
GLUCOSE, UA: NEGATIVE mg/dL
HGB URINE DIPSTICK: NEGATIVE
Ketones, ur: NEGATIVE mg/dL
NITRITE: NEGATIVE
Protein, ur: NEGATIVE mg/dL
SPECIFIC GRAVITY, URINE: 1.028 (ref 1.005–1.030)
pH: 5 (ref 5.0–8.0)

## 2017-02-02 LAB — LIPASE, BLOOD: Lipase: 33 U/L (ref 11–51)

## 2017-02-02 NOTE — ED Triage Notes (Signed)
Pt c/o left flank/LLQ pain for a few days.  Has hx ovarian cyst and feels similar to this but also has had BV and wants to make sure not that.  No NVD.  Ambulatory to triage.  VSS.  Eating biscuit when RN called name for triage.

## 2017-02-02 NOTE — ED Notes (Signed)
First nurse note  Presents with left sided abd pain  Hx of ovarian cyst.

## 2017-02-02 NOTE — ED Notes (Signed)
States she is not able to wait any longer.
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
## 2017-02-02 NOTE — ED Notes (Signed)
poc t preg was negative

## 2017-03-15 ENCOUNTER — Emergency Department
Admission: EM | Admit: 2017-03-15 | Discharge: 2017-03-15 | Discharge status: Against Medical Advice | Payer: Medicaid Other

## 2017-04-15 ENCOUNTER — Other Ambulatory Visit: Payer: Self-pay | Admitting: Physician Assistant

## 2017-04-15 DIAGNOSIS — Z369 Encounter for antenatal screening, unspecified: Secondary | ICD-10-CM

## 2017-05-12 ENCOUNTER — Encounter: Payer: Self-pay | Admitting: *Deleted

## 2017-05-12 ENCOUNTER — Ambulatory Visit
Admission: RE | Admit: 2017-05-12 | Discharge: 2017-05-12 | Disposition: A | Discharge status: Home / Self Care | Payer: Medicaid Other | Source: Ambulatory Visit | Attending: Physician Assistant | Admitting: Physician Assistant

## 2017-05-12 ENCOUNTER — Ambulatory Visit (HOSPITAL_BASED_OUTPATIENT_CLINIC_OR_DEPARTMENT_OTHER)
Admission: RE | Admit: 2017-05-12 | Discharge: 2017-05-12 | Disposition: A | Discharge status: Home / Self Care | Payer: Medicaid Other | Source: Ambulatory Visit | Attending: Obstetrics & Gynecology | Admitting: Obstetrics & Gynecology

## 2017-05-12 VITALS — BP 113/73 | HR 90 | Temp 98.1°F | Resp 18 | Ht 67.2 in | Wt 117.0 lb

## 2017-05-12 DIAGNOSIS — O26891 Other specified pregnancy related conditions, first trimester: Secondary | ICD-10-CM | POA: Insufficient documentation

## 2017-05-12 DIAGNOSIS — Z369 Encounter for antenatal screening, unspecified: Secondary | ICD-10-CM

## 2017-05-12 DIAGNOSIS — O99331 Smoking (tobacco) complicating pregnancy, first trimester: Secondary | ICD-10-CM | POA: Diagnosis not present

## 2017-05-12 DIAGNOSIS — F17200 Nicotine dependence, unspecified, uncomplicated: Secondary | ICD-10-CM | POA: Diagnosis not present

## 2017-05-12 DIAGNOSIS — Z3A12 12 weeks gestation of pregnancy: Secondary | ICD-10-CM | POA: Diagnosis not present

## 2017-05-12 DIAGNOSIS — Z3689 Encounter for other specified antenatal screening: Secondary | ICD-10-CM | POA: Diagnosis not present

## 2017-05-12 DIAGNOSIS — Z818 Family history of other mental and behavioral disorders: Secondary | ICD-10-CM

## 2017-05-12 DIAGNOSIS — R636 Underweight: Secondary | ICD-10-CM | POA: Diagnosis not present

## 2017-05-12 NOTE — Progress Notes (Addendum)
Ulanda Edison Length of Consultation: 40 minutes   Ms. Spera  was referred to [facility] for genetic counseling to review prenatal screening and testing options.  This note summarizes the information we discussed.    We offered the following routine screening tests for this pregnancy:  First trimester screening, which includes nuchal translucency ultrasound screen and first trimester maternal serum marker screening.  The nuchal translucency has approximately an 80% detection rate for Down syndrome and can be positive for other chromosome abnormalities as well as congenital heart defects.  When combined with a maternal serum marker screening, the detection rate is up to 90% for Down syndrome and up to 97% for trisomy 18.     Maternal serum marker screening, a blood test that measures pregnancy proteins, can provide risk assessments for Down syndrome, trisomy 18, and open neural tube defects (spina bifida, anencephaly). Because it does not directly examine the fetus, it cannot positively diagnose or rule out these problems.  Targeted ultrasound uses high frequency sound waves to create an image of the developing fetus.  An ultrasound is often recommended as a routine means of evaluating the pregnancy.  It is also used to screen for fetal anatomy problems (for example, a heart defect) that might be suggestive of a chromosomal or other abnormality.   Should these screening tests indicate an increased concern, then the following additional testing options would be offered:  The chorionic villus sampling procedure is available for first trimester chromosome analysis.  This involves the withdrawal of a small amount of chorionic villi (tissue from the developing placenta).  Risk of pregnancy loss is estimated to be approximately 1 in 200 to 1 in 100 (0.5 to 1%).  There is approximately a 1% (1 in 100) chance that the CVS chromosome results will be unclear.  Chorionic villi  cannot be tested for neural tube defects.     Amniocentesis involves the removal of a small amount of amniotic fluid from the sac surrounding the fetus with the use of a thin needle inserted through the maternal abdomen and uterus.  Ultrasound guidance is used throughout the procedure.  Fetal cells from amniotic fluid are directly evaluated and > 99.5% of chromosome problems and > 98% of open neural tube defects can be detected. This procedure is generally performed after the 15th week of pregnancy.  The main risks to this procedure include complications leading to miscarriage in less than 1 in 200 cases (0.5%).  As another option for information if the pregnancy is suspected to be an an increased chance for certain chromosome conditions, we also reviewed the availability of cell free fetal DNA testing from maternal blood to determine whether or not the baby may have either Down syndrome, trisomy 63, or trisomy 55.  This test utilizes a maternal blood sample and DNA sequencing technology to isolate circulating cell free fetal DNA from maternal plasma.  The fetal DNA can then be analyzed for DNA sequences that are derived from the three most common chromosomes involved in aneuploidy, chromosomes 13, 18, and 21.  If the overall amount of DNA is greater than the expected level for any of these chromosomes, aneuploidy is suspected.  While we do not consider it a replacement for invasive testing and karyotype analysis, a negative result from this testing would be reassuring, though not a guarantee of a normal chromosome complement for the baby.  An abnormal result is certainly suggestive of an abnormal chromosome complement, though we would still recommend  CVS or amniocentesis to confirm any findings from this testing.  Cystic Fibrosis and Spinal Muscular Atrophy (SMA) screening were also discussed with the patient. Both conditions are recessive, which means that both parents must be carriers in order to have a  child with the disease.  Cystic fibrosis (CF) is one of the most common genetic conditions in persons of Caucasian ancestry.  This condition occurs in approximately 1 in 2,500 Caucasian persons and results in thickened secretions in the lungs, digestive, and reproductive systems.  For a baby to be at risk for having CF, both of the parents must be carriers for this condition.  Approximately 1 in 4325 Caucasian persons is a carrier for CF.  Current carrier testing looks for the most common mutations in the gene for CF and can detect approximately 90% of carriers in the Caucasian population.  This means that the carrier screening can greatly reduce, but cannot eliminate, the chance for an individual to have a child with CF.  If an individual is found to be a carrier for CF, then carrier testing would be available for the partner. As part of [place] newborn screening profile, all babies born in the state of [place] will have a two-tier screening process.  Specimens are first tested to determine the concentration of immunoreactive trypsinogen (IRT).  The top 5% of specimens with the highest IRT values then undergo DNA testing using a panel of over 40 common CF mutations. SMA is a neurodegenerative disorder that leads to atrophy of skeletal muscle and overall weakness.  This condition is also more prevalent in the Caucasian population, with 1 in 40-1 in 60 persons being a carrier and 1 in 6,000-1 in 10,000 children being affected.  There are multiple forms of the disease, with some causing death in infancy to other forms with survival into adulthood.  The genetics of SMA is complex, but carrier screening can detect up to 95% of carriers in the Caucasian population.  Similar to CF, a negative result can greatly reduce, but cannot eliminate, the chance to have a child with SMA.  We obtained a detailed family history and pregnancy history.  Ms. Sherron FlemingsCarden reported that her sister has a son with possible mild  autistic features.  This nephew is 20 years old and has no other health concerns and no specific cause identified for his differences.  She also reported that her father has a son (a half sibling to the patient) who is 20 years old and has severe autism.  The patient has no details about the cause for his condition or information about any assessments that have been done to determine the cause for his condition.  We explained that there may be many factors involved in the development of autism, but that without a known diagnosis, a recurrence risk estimate for other family members is difficult.  We also discussed the option of screening for Fragile X syndrome, which is one of the most common causes of inherited mental retardation in males, and may present with symptoms of autism.  Fragile X syndrome is caused by a change, or expansion, of the DNA in the gene FMR1.  Carrier screening is used to determine the number of repeats in this region of the gene and to determine the likelihood of having a child with this condition.  A negative result would mean that the individual undergoing carrier testing is not at risk for having a child with Fragile X, but cannot exclude other causes for autism  or developmental differences.  We are happy to discuss this history further is more is learned or medical records become available.  The remainder of the family history was reported to be unremarkable for birth defects, intellectual delays, recurrent pregnancy loss or known chromosome abnormalities.  Ms. Brines stated that this is her fourth pregnancy, she has one 76 month old son who is in good health and had two early miscarriages.  This is the first pregnancy with her current partner.  She reported no complications or exposures that would be expected to increase the risk for birth defects.  She is prescribed iron by her OB for iron deficiency.  After consideration of the options, Ms. Sundby elected to proceed with first  trimester screening.  She declined CF, SMA and Fragile X carrier screening.  An ultrasound was performed at the time of the visit.  The gestational age was consistent with 12 weeks.  Fetal anatomy could not be assessed due to early gestational age.  Please refer to the ultrasound report for details of that study.  Ms. Ketron was encouraged to call with questions or concerns.  We can be contacted at [phone].   Cherly Anderson, MS, CGC  Odalys Win, [place] A, MD

## 2017-05-19 ENCOUNTER — Telehealth: Payer: Self-pay | Admitting: Obstetrics and Gynecology

## 2017-05-19 NOTE — Telephone Encounter (Signed)
  Peggy Chandler elected to undergo First Trimester screening on 05/12/2017.  To review, first trimester screening, includes nuchal translucency ultrasound screen and/or first trimester maternal serum marker screening.  The nuchal translucency has approximately an 80% detection rate for Down syndrome and can be positive for other chromosome abnormalities as well as heart defects.  When combined with a maternal serum marker screening, the detection rate is up to 90% for Down syndrome and up to 97% for trisomy 13 and 18.     The results of the First Trimester Nuchal Translucency and Biochemical Screening were within normal range.  The risk for Down syndrome is now estimated to be less than 1 in 10,000.  The risk for Trisomy 13/18 is also estimated to be less than 1 in 10,000.  Should more definitive information be desired, we would offer amniocentesis.  Because we do not yet know the effectiveness of combined first and second trimester screening, we do not recommend a maternal serum screen to assess the chance for chromosome conditions.  However, if screening for neural tube defects is desired, maternal serum screening for AFP only can be performed between 15 and [redacted] weeks gestation.      [name], MS, CGC

## 2017-06-13 NOTE — Addendum Note (Signed)
Encounter addended by: Betzalel Umbarger, MD on: 06/13/2017 8:09 AM  Actions taken: Sign clinical note

## 2017-06-20 ENCOUNTER — Other Ambulatory Visit: Payer: Self-pay | Admitting: *Deleted

## 2017-06-20 DIAGNOSIS — O99012 Anemia complicating pregnancy, second trimester: Secondary | ICD-10-CM

## 2017-06-23 ENCOUNTER — Ambulatory Visit
Admission: RE | Admit: 2017-06-23 | Discharge: 2017-06-23 | Disposition: A | Discharge status: Home / Self Care | Payer: Medicaid Other | Source: Ambulatory Visit | Attending: Maternal & Fetal Medicine | Admitting: Maternal & Fetal Medicine

## 2017-06-23 DIAGNOSIS — Z3A18 18 weeks gestation of pregnancy: Secondary | ICD-10-CM | POA: Diagnosis not present

## 2017-06-23 DIAGNOSIS — O99012 Anemia complicating pregnancy, second trimester: Secondary | ICD-10-CM | POA: Diagnosis not present

## 2017-06-23 DIAGNOSIS — Z363 Encounter for antenatal screening for malformations: Secondary | ICD-10-CM | POA: Diagnosis present

## 2017-07-13 ENCOUNTER — Observation Stay
Admission: EM | Admit: 2017-07-13 | Discharge: 2017-07-13 | Disposition: A | Discharge status: Home / Self Care | Payer: Medicaid Other | Attending: Obstetrics & Gynecology | Admitting: Obstetrics & Gynecology

## 2017-07-13 ENCOUNTER — Other Ambulatory Visit: Payer: Self-pay | Admitting: Maternal Newborn

## 2017-07-13 ENCOUNTER — Encounter: Payer: Self-pay | Admitting: Maternal Newborn

## 2017-07-13 DIAGNOSIS — J45909 Unspecified asthma, uncomplicated: Secondary | ICD-10-CM | POA: Diagnosis not present

## 2017-07-13 DIAGNOSIS — F17210 Nicotine dependence, cigarettes, uncomplicated: Secondary | ICD-10-CM | POA: Diagnosis not present

## 2017-07-13 DIAGNOSIS — M419 Scoliosis, unspecified: Secondary | ICD-10-CM | POA: Insufficient documentation

## 2017-07-13 DIAGNOSIS — O9989 Other specified diseases and conditions complicating pregnancy, childbirth and the puerperium: Secondary | ICD-10-CM | POA: Diagnosis present

## 2017-07-13 DIAGNOSIS — O99512 Diseases of the respiratory system complicating pregnancy, second trimester: Secondary | ICD-10-CM | POA: Insufficient documentation

## 2017-07-13 DIAGNOSIS — A749 Chlamydial infection, unspecified: Secondary | ICD-10-CM | POA: Insufficient documentation

## 2017-07-13 DIAGNOSIS — Z886 Allergy status to analgesic agent status: Secondary | ICD-10-CM | POA: Insufficient documentation

## 2017-07-13 DIAGNOSIS — N390 Urinary tract infection, site not specified: Secondary | ICD-10-CM

## 2017-07-13 DIAGNOSIS — O23592 Infection of other part of genital tract in pregnancy, second trimester: Secondary | ICD-10-CM

## 2017-07-13 DIAGNOSIS — Z79899 Other long term (current) drug therapy: Secondary | ICD-10-CM | POA: Diagnosis not present

## 2017-07-13 DIAGNOSIS — F419 Anxiety disorder, unspecified: Secondary | ICD-10-CM | POA: Insufficient documentation

## 2017-07-13 DIAGNOSIS — M1990 Unspecified osteoarthritis, unspecified site: Secondary | ICD-10-CM | POA: Diagnosis not present

## 2017-07-13 DIAGNOSIS — O99332 Smoking (tobacco) complicating pregnancy, second trimester: Secondary | ICD-10-CM | POA: Diagnosis not present

## 2017-07-13 DIAGNOSIS — O99342 Other mental disorders complicating pregnancy, second trimester: Secondary | ICD-10-CM | POA: Insufficient documentation

## 2017-07-13 DIAGNOSIS — Z3A20 20 weeks gestation of pregnancy: Secondary | ICD-10-CM | POA: Insufficient documentation

## 2017-07-13 DIAGNOSIS — B9689 Other specified bacterial agents as the cause of diseases classified elsewhere: Secondary | ICD-10-CM | POA: Diagnosis present

## 2017-07-13 DIAGNOSIS — N760 Acute vaginitis: Secondary | ICD-10-CM

## 2017-07-13 DIAGNOSIS — O2342 Unspecified infection of urinary tract in pregnancy, second trimester: Secondary | ICD-10-CM | POA: Diagnosis not present

## 2017-07-13 LAB — URINALYSIS, COMPLETE (UACMP) WITH MICROSCOPIC
Bilirubin Urine: NEGATIVE
Glucose, UA: NEGATIVE mg/dL
HGB URINE DIPSTICK: NEGATIVE
KETONES UR: NEGATIVE mg/dL
NITRITE: NEGATIVE
PROTEIN: NEGATIVE mg/dL
Specific Gravity, Urine: 1.021 (ref 1.005–1.030)
pH: 6 (ref 5.0–8.0)

## 2017-07-13 LAB — WET PREP, GENITAL
Sperm: NONE SEEN
Trich, Wet Prep: NONE SEEN
YEAST WET PREP: NONE SEEN

## 2017-07-13 LAB — CHLAMYDIA/NGC RT PCR (ARMC ONLY)
Chlamydia Tr: DETECTED — AB
N GONORRHOEAE: NOT DETECTED

## 2017-07-13 MED ORDER — ACETAMINOPHEN 325 MG PO TABS: 650.0000 mg | ORAL_TABLET | ORAL | Status: DC | PRN

## 2017-07-13 MED ORDER — METRONIDAZOLE 0.75 % VA GEL: 1.0000 | Freq: Every day | VAGINAL | 1 refills | Status: AC

## 2017-07-13 MED ORDER — AZITHROMYCIN 500 MG PO TABS: 1000.0000 mg | ORAL_TABLET | Freq: Once | ORAL | 0 refills | Status: AC

## 2017-07-13 MED ORDER — CEPHALEXIN 500 MG PO CAPS: 500.0000 mg | ORAL_CAPSULE | Freq: Four times a day (QID) | ORAL | 0 refills | Status: AC

## 2017-07-13 NOTE — OB Triage Note (Signed)
Recvd to OBS3 with c/o right flank pain.  Changed to gown and to bed.  Doppled fetal heart tones at 155 bpm.  Plan of care discussed.  Oriented to room.  Agrees with plan.  Verbalized understanding.

## 2017-07-13 NOTE — Final Progress Note (Addendum)
Physician Final Progress Note  Patient ID: Peggy Chandler MRN: [id number] DOB/AGE: 09/26/1995 21 y.o.  Admit date: 07/13/2017 Admitting provider: Nadara Mustardobert P Harris, MD Discharge date: 07/13/2017   Admission Diagnoses: Right flank pain  Discharge Diagnoses:  Active Problems:   Indication for care in labor and delivery, antepartum   Bacterial vaginosis   Urinary tract infection   History of Present Illness: The patient is a 21 y.o. female [phone] at 2310w0d who presents for right flank pain that began yesterday. She ranks the pain as 7/10. She has not tried anything to relieve the pain. She has urinary urgency and frequency. She denies fever, chills, dysuria and hematuria. She has also had some vulvar irritation and an increase in vaginal discharge. Good fetal movement. Denies vaginal bleeding, leaking of fluid, and contractions.   10 point review of systems negative unless otherwise noted in HPI.  Past Medical History:  Diagnosis Date  . Anemia   . Anxiety   . Arthritis   . Asthma   . Endometriosis   . Headache(784.0)   . Hives   . Ovarian cyst   . Scoliosis     Past Surgical History:  Procedure Laterality Date  . OVARIAN CYST REMOVAL    . TONSILLECTOMY    . WRIST SURGERY      No current facility-administered medications on file prior to encounter.    Current Outpatient Medications on File Prior to Encounter  Medication Sig Dispense Refill  . brompheniramine-pseudoephedrine-DM 30-2-10 MG/5ML syrup Take 5 mLs by mouth 4 (four) times daily as needed. (Patient not taking: Reported on 05/12/2017) 120 mL 0  . Ferrous Sulfate (SLOW FE) 142 (45 Fe) MG TBCR Take 1 capsule by mouth daily. 30 tablet   . Prenatal Multivit-Min-Fe-FA (PRENATAL VITAMINS) 0.8 MG tablet Take 1 tablet by mouth daily. 90 tablet 3    Allergies  Allergen Reactions  . Asa [Aspirin] Other (See Comments)    Syncope   . Asa [Aspirin] Hives    Social History   Socioeconomic History  .  Marital status: Single    Spouse name: Not on file  . Number of children: Not on file  . Years of education: Not on file  . Highest education level: Not on file  Social Needs  . Financial resource strain: Not on file  . Food insecurity - worry: Not on file  . Food insecurity - inability: Not on file  . Transportation needs - medical: Not on file  . Transportation needs - non-medical: Not on file  Occupational History  . 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 Smoking status: Current Every Day Smoker    Packs/day: 1.00    Types: Cigarettes    Last attempt to quit: 12/16/2015    Years since quitting: 1.5  . Smokeless tobacco: Never Used  Substance and Sexual Activity  . Alcohol use: No    Comment: Hx of alcohol use no recent use  . Drug use: No  . Sexual activity: Yes    Partners: Male    Birth control/protection: Injection  Other Topics Concern  . Not on file  Social History Narrative   ** Merged History Encounter **        Physical Exam: BP 115/65 (BP Location: Left Arm)   Pulse 77   Temp 98.5 F (36.9 C) (Oral)   Resp 16   Ht 5\' 7"  (1.702 m)   Wt 117 lb (53.1 kg)  LMP 02/16/2017   BMI 18.32 kg/m   Gen: NAD External genitalia, some vulvar irritation, vaginal mucosa appears normal except for white discharge Pelvic: SSE, cervix closed, thick, high, normal appearance with adherent white discharge Ext: no edema, no tenderness, no signs of DVT FHT: 155 bpm  Consults: None  Significant Findings/ Diagnostic Studies: labs: UA with large leukocytes, wet prep with clue cells, adherent white discharge and pH>5  Procedures: None  Discharge Condition: good  Disposition: Discharge home, begin antibiotics for UTI and metronidazole gel for BV.  Diet: Regular diet  Discharge Activity: Activity as tolerated  Discharge Instructions    Discharge activity:  No Restrictions   Complete by:  As directed    Discharge diet:   No restrictions   Complete by:  As directed    No sexual activity restrictions   Complete by:  As directed    Notify physician for a general feeling that "something is not right"   Complete by:  As directed    Notify physician for increase or change in vaginal discharge   Complete by:  As directed    Notify physician for intestinal cramps, with or without diarrhea, sometimes described as "gas pain"   Complete by:  As directed    Notify physician for leaking of fluid   Complete by:  As directed    Notify physician for low, dull backache, unrelieved by heat or Tylenol   Complete by:  As directed    Notify physician for menstrual like cramps   Complete by:  As directed    Notify physician for pelvic pressure   Complete by:  As directed    Notify physician for uterine contractions.  These may be painless and feel like the uterus is tightening or the baby is  "balling up"   Complete by:  As directed    Notify physician for vaginal bleeding   Complete by:  As directed    PRETERM LABOR:  Includes any of the follwing symptoms that occur between 20 - [redacted] weeks gestation.  If these symptoms are not stopped, preterm labor can result in preterm delivery, placing your baby at risk   Complete by:  As directed      Allergies as of 07/13/2017      Reactions   Asa [aspirin] Other (See Comments)   Syncope    Asa [aspirin] Hives      Medication List    TAKE these medications   brompheniramine-pseudoephedrine-DM 30-2-10 MG/5ML syrup Take 5 mLs by mouth 4 (four) times daily as needed.   cephALEXin 500 MG capsule Commonly known as:  KEFLEX Take 1 capsule (500 mg total) by mouth 4 (four) times daily for 10 days.   Ferrous Sulfate 142 (45 Fe) MG Tbcr Commonly known as:  SLOW FE Take 1 capsule by mouth daily.   metroNIDAZOLE 0.75 % vaginal gel Commonly known as:  METROGEL Place 1 Applicatorful vaginally at bedtime for 5 days.   Prenatal Vitamins 0.8 MG tablet Take 1 tablet by mouth daily.       Follow-up Information    Department, [facility]. Schedule an appointment as soon as possible for a visit in 1 week(s).   Contact information: [address] [place] [address] [place] [phone]           Total time spent taking care of this patient: 20 minutes.  Signed: Oswaldo ConroyJacelyn Y Digby Groeneveld, CNM  07/13/2017, 6:09 PM

## 2017-07-13 NOTE — Progress Notes (Signed)
Left VM for patient notifying her that an additional medication was sent to her pharmacy for chlamydia (as we discussed in triage today); positive result arrived after patient discharged.  Marcelyn BruinsJacelyn Tiant Peixoto, CNM 07/13/2017  6:30 PM

## 2017-07-13 NOTE — Discharge Summary (Signed)
See Final Progress Note 07/13/2017.  Peggy BruinsJacelyn Chandler, CNM 07/13/2017  6:16 PM

## 2017-07-14 NOTE — Progress Notes (Signed)
Pt has medicine called in for tx of Chlamydia by CNM Schmid.

## 2017-07-16 LAB — URINE CULTURE

## 2017-07-28 ENCOUNTER — Observation Stay
Admission: EM | Admit: 2017-07-28 | Discharge: 2017-07-28 | Disposition: A | Discharge status: Home / Self Care | Payer: Medicaid Other | Attending: Maternal Newborn | Admitting: Maternal Newborn

## 2017-07-28 ENCOUNTER — Other Ambulatory Visit: Payer: Self-pay

## 2017-07-28 DIAGNOSIS — O99012 Anemia complicating pregnancy, second trimester: Secondary | ICD-10-CM | POA: Insufficient documentation

## 2017-07-28 DIAGNOSIS — R109 Unspecified abdominal pain: Secondary | ICD-10-CM | POA: Diagnosis not present

## 2017-07-28 DIAGNOSIS — O99332 Smoking (tobacco) complicating pregnancy, second trimester: Secondary | ICD-10-CM | POA: Insufficient documentation

## 2017-07-28 DIAGNOSIS — Z3A23 23 weeks gestation of pregnancy: Secondary | ICD-10-CM | POA: Diagnosis not present

## 2017-07-28 DIAGNOSIS — B9689 Other specified bacterial agents as the cause of diseases classified elsewhere: Secondary | ICD-10-CM | POA: Diagnosis present

## 2017-07-28 DIAGNOSIS — F17210 Nicotine dependence, cigarettes, uncomplicated: Secondary | ICD-10-CM | POA: Insufficient documentation

## 2017-07-28 DIAGNOSIS — O26892 Other specified pregnancy related conditions, second trimester: Principal | ICD-10-CM | POA: Insufficient documentation

## 2017-07-28 DIAGNOSIS — Z79899 Other long term (current) drug therapy: Secondary | ICD-10-CM | POA: Insufficient documentation

## 2017-07-28 DIAGNOSIS — N760 Acute vaginitis: Secondary | ICD-10-CM

## 2017-07-28 DIAGNOSIS — Z886 Allergy status to analgesic agent status: Secondary | ICD-10-CM | POA: Insufficient documentation

## 2017-07-28 LAB — CHLAMYDIA/NGC RT PCR (ARMC ONLY)
CHLAMYDIA TR: NOT DETECTED
N gonorrhoeae: NOT DETECTED

## 2017-07-28 LAB — WET PREP, GENITAL
Sperm: NONE SEEN
Trich, Wet Prep: NONE SEEN
YEAST WET PREP: NONE SEEN

## 2017-07-28 MED ORDER — METRONIDAZOLE 500 MG PO TABS: 500.0000 mg | ORAL_TABLET | Freq: Two times a day (BID) | ORAL | 0 refills | Status: AC

## 2017-07-28 MED ORDER — ACETAMINOPHEN 325 MG PO TABS: 650.0000 mg | ORAL_TABLET | ORAL | Status: DC | PRN

## 2017-07-28 NOTE — OB Triage Note (Signed)
Delay in patient admission and OBIX tracing r/t delay in admission to Epic by ED registration.  See paper tracing

## 2017-07-28 NOTE — Discharge Summary (Signed)
See Final Progress Note 07/28/2017.  Marcelyn BruinsJacelyn Schmid, CNM 07/28/2017  9:56 PM

## 2017-07-28 NOTE — Final Progress Note (Addendum)
Physician Final Progress Note  Patient ID: Peggy Chandler MRN: [id number] DOB/AGE: 04/07/1996 21 y.o.  Admit date: 07/28/2017 Admitting provider: Conard Novak, MD Discharge date: 07/28/2017   Admission Diagnoses: Possible ROM, bilateral lower abdominal pain  Discharge Diagnoses:  Active Problems:   Indication for care in labor and delivery, antepartum Bacterial vaginosis  History of Present Illness: The patient is a 22 y.o. female [phone] at [redacted]w[redacted]d who presents for possible PROM. She was at home and felt some fluid run down her leg when she bent over. It was clear/white and had no odor. She has also had some pain in her lower abdomen in the area of her round ligaments that has been present for a couple of weeks. She has not tried anything to relieve the pain. It is intermittent and of moderate intensity. Denies vaginal/vulvar irritation and itching, fever, chills, dysuria, vaginal bleeding, and contractions.  10 point review of systems negative unless otherwise noted in HPI  Past Medical History:  Diagnosis Date  . Anemia   . Anxiety   . Arthritis   . Asthma   . Endometriosis   . Headache(784.0)   . Hives   . Ovarian cyst   . Scoliosis     Past Surgical History:  Procedure Laterality Date  . OVARIAN CYST REMOVAL    . TONSILLECTOMY    . WRIST SURGERY      No current facility-administered medications on file prior to encounter.    Current Outpatient Medications on File Prior to Encounter  Medication Sig Dispense Refill  . brompheniramine-pseudoephedrine-DM 30-2-10 MG/5ML syrup Take 5 mLs by mouth 4 (four) times daily as needed. (Patient not taking: Reported on 05/12/2017) 120 mL 0  . Ferrous Sulfate (SLOW FE) 142 (45 Fe) MG TBCR Take 1 capsule by mouth daily. 30 tablet   . Prenatal Multivit-Min-Fe-FA (PRENATAL VITAMINS) 0.8 MG tablet Take 1 tablet by mouth daily. 90 tablet 3    Allergies  Allergen Reactions  . Asa [Aspirin] Other (See Comments)     Syncope   . Asa [Aspirin] Hives    Social History   Socioeconomic History  . Marital status: Single    Spouse name: Not on file  . Number of children: Not on file  . Years of education: Not on file  . Highest education level: Not on file  Social Needs  . Financial resource strain: Not on file  . Food insecurity - worry: Not on file  . Food insecurity - inability: Not on file  . Transportation needs - medical: Not on file  . Transportation needs - non-medical: Not on file  Occupational History  . 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 Smoking status: Current Every Day Smoker    Packs/day: 1.00    Types: Cigarettes    Last attempt to quit: 12/16/2015    Years since quitting: 1.6  . Smokeless tobacco: Never Used  Substance and Sexual Activity  . Alcohol use: No    Comment: Hx of alcohol use no recent use  . Drug use: No  . Sexual activity: Yes    Partners: Male    Birth control/protection: None  Other Topics Concern  . Not on file  Social History Narrative   ** Merged History Encounter **        Physical Exam: BP (!) 110/56 (BP Location: Left Arm)   Pulse 71   Temp 98.4 F (36.9 C) (Oral)   LMP  02/16/2017   Gen: NAD Pulm: No increased work of breathing External genitalia: normal appearing Pelvic: SSE; cervix visually closed with no lesions, no pooling of fluid; thin, white, adherent discharge present Ext: no edema, tenderness, or signs of DVT  FHT: 155 bpm  Consults: None  Significant Findings/ Diagnostic Studies: Negative ferning, pooling, and nitrazine. Negative for gonorrhea and chlamydia. Labs: positive clue cells, meets criteria for BV with adherent white discharge and positive whiff.   Procedures: SSE, see above  Discharge Condition: good  Disposition: 01-Home or Self Care  Diet: Regular diet  Discharge Activity: Activity as tolerated, refrain from intercourse during treatment for BV.  Discharge  Instructions    Discharge activity:  No Restrictions   Complete by:  As directed    Discharge diet:  No restrictions   Complete by:  As directed    Notify physician for a general feeling that "something is not right"   Complete by:  As directed    Notify physician for increase or change in vaginal discharge   Complete by:  As directed    Notify physician for intestinal cramps, with or without diarrhea, sometimes described as "gas pain"   Complete by:  As directed    Notify physician for leaking of fluid   Complete by:  As directed    Notify physician for low, dull backache, unrelieved by heat or Tylenol   Complete by:  As directed    Notify physician for menstrual like cramps   Complete by:  As directed    Notify physician for pelvic pressure   Complete by:  As directed    Notify physician for uterine contractions.  These may be painless and feel like the uterus is tightening or the baby is  "balling up"   Complete by:  As directed    Notify physician for vaginal bleeding   Complete by:  As directed    PRETERM LABOR:  Includes any of the follwing symptoms that occur between 20 - [redacted] weeks gestation.  If these symptoms are not stopped, preterm labor can result in preterm delivery, placing your baby at risk   Complete by:  As directed    Sexual Activity:     Complete by:  As directed    Refrain from intercourse during treatment period (7 days).     Allergies as of 07/28/2017      Reactions   Asa [aspirin] Other (See Comments)   Syncope    Asa [aspirin] Hives      Medication List    STOP taking these medications   brompheniramine-pseudoephedrine-DM 30-2-10 MG/5ML syrup     TAKE these medications   Ferrous Sulfate 142 (45 Fe) MG Tbcr Commonly known as:  SLOW FE Take 1 capsule by mouth daily.   metroNIDAZOLE 500 MG tablet Commonly known as:  FLAGYL Take 1 tablet (500 mg total) by mouth 2 (two) times daily for 7 days.   Prenatal Vitamins 0.8 MG tablet Take 1 tablet by  mouth daily.        Total time spent taking care of this patient: 15 minutes  Signed: Oswaldo ConroyJacelyn Y Schmid, CNM  07/28/2017, 9:53 PM

## 2017-07-28 NOTE — Discharge Instructions (Signed)
Use gel as prescribed.

## 2017-07-28 NOTE — OB Triage Note (Signed)
Patient arrived on unit via wheelchair from ED complaining of leaking fluid.  Patient states she bent over in kitchen and noted fluid leaking down her legs.  Patient also complaints of bilateral lower abdominal pressure that has come and gone over past few weeks.

## 2017-08-01 ENCOUNTER — Encounter: Payer: Self-pay | Admitting: Obstetrics and Gynecology

## 2017-08-01 ENCOUNTER — Encounter: Payer: Medicaid Other | Admitting: Obstetrics and Gynecology

## 2017-08-01 ENCOUNTER — Ambulatory Visit (INDEPENDENT_AMBULATORY_CARE_PROVIDER_SITE_OTHER): Payer: Medicaid Other | Admitting: Obstetrics and Gynecology

## 2017-08-01 VITALS — BP 102/58 | Wt 128.0 lb

## 2017-08-01 DIAGNOSIS — D649 Anemia, unspecified: Secondary | ICD-10-CM

## 2017-08-01 DIAGNOSIS — Z3480 Encounter for supervision of other normal pregnancy, unspecified trimester: Secondary | ICD-10-CM

## 2017-08-01 DIAGNOSIS — F411 Generalized anxiety disorder: Secondary | ICD-10-CM

## 2017-08-01 DIAGNOSIS — Z3A23 23 weeks gestation of pregnancy: Secondary | ICD-10-CM

## 2017-08-01 DIAGNOSIS — O2686 Pruritic urticarial papules and plaques of pregnancy (PUPPP): Secondary | ICD-10-CM

## 2017-08-01 MED ORDER — DESOXIMETASONE 0.25 % EX CREA: 1.0000 "application " | TOPICAL_CREAM | Freq: Two times a day (BID) | CUTANEOUS | 1 refills | Status: DC

## 2017-08-01 NOTE — Patient Instructions (Signed)
Pruritic Urticarial Papules and Plaques of Pregnancy °When you are pregnant, your body changes in many ways. That includes the skin. Rashes sometimes develop. One skin rash that can happen during pregnancy is called pruritic urticarial papules and plaques of pregnancy (PUPPP). The small red bumps sometimes form large plaques. These are very itchy. The rash usually appears in the last few weeks of pregnancy during the third trimester. Sometimes, it can occur shortly after giving birth. It goes away shortly after your baby is born. It does not harm you or your baby and will not leave scars on your skin. PUPPP is most common in first pregnancies or in those involving more than one baby. It usually will not return during later pregnancies. °What are the causes? °The exact cause is unknown. However, it may be related to your skin stretching rapidly due to pregnancy. °What are the signs or symptoms? °PUPPP symptoms include a very itchy rash. The rash often looks red and raised and is most often seen on the abdomen. It can spread to the legs, thighs, or arms. Sometimes tiny blisters form in the center of the rash patches. The skin around the rash is often pale. °How is this diagnosed? °To decide if you have PUPPP, your health care provider will perform a physical exam and ask questions about your symptoms. He or she may order blood tests to rule out other causes of the rash. °How is this treated? °The goal is to stop the itching and keep the rash from spreading. Usually, a cream is used to do this. However, treatment varies. Common options include medicines that relieve or lessen itching. Some medicines may be in the form of a cream or ointment, while others you may take by mouth (orally). The medicines are either corticosteroids or antihistamines. Treatment helps nearly all women with this rash. The creams, ointments, or pills should make your skin feel better fairly quickly. °Follow these instructions at home: °· Only  take over-the-counter or prescription medicines as directed by your health care provider. °· Apply any creams as directed by your health care provider. °· Do not scratch the rash. °· Wear loose clothing. °· Keep all follow-up appointments with your health care provider. °Contact a health care provider if: °· The itching does not go away after treatment. °· Your rash continues to spread. °· You are unable to sleep because of the irritation. °This information is not intended to replace advice given to you by your health care provider. Make sure you discuss any questions you have with your health care provider. °Document Released: 09/29/2009 Document Revised: 12/11/2015 Document Reviewed: 12/17/2012 °Elsevier Interactive Patient Education © 2018 Elsevier Inc. ° °

## 2017-08-01 NOTE — Progress Notes (Signed)
08/01/2017   Chief Complaint: Missed period  Transfer of Care Patient: yes  History of Present Illness: Peggy Chandler is a 22 y.o. G3P1011 5183w5d based on Patient's last menstrual period was 02/16/2017 (lmp unknown). with an Estimated Date of Delivery: 11/23/17, with the above CC.  NOB today. Transfer of care from [facility]. Pt c/o dry patches of itchy skin and small red bumps.  Her periods were: regular periods every 28 days She was using no method when she conceived.  She has Negative signs or symptoms of nausea/vomiting of pregnancy. She has Negative signs or symptoms of miscarriage or preterm labor She identifies Negative Zika risk factors for her and her partner On any different medications around the time she conceived/early pregnancy: No  History of varicella: No   ROS: A 12-point review of systems was performed and negative, except as stated in the above HPI.  OBGYN History: As per HPI. OB History  Gravida Para Term Preterm AB Living  3 1 1  0 1 1  SAB TAB Ectopic Multiple Live Births  1 0 0 0 1    # Outcome Date GA Lbr Len/2nd Weight Sex Delivery Anes PTL Lv  3 Current           2 Term 02/21/16 4672w5d / 00:09 6 lb 10.2 oz (3.01 kg) M Vag-Spont None  LIV     Birth Comments: none observed at delivery  1 SAB               Any issues with any prior pregnancies: no Any prior children are healthy, doing well, without any problems or issues: yes History of pap smears: No, under 21. History of STIs: Yes   Past Medical History: Past Medical History:  Diagnosis Date  . Anemia   . Anxiety   . Arthritis   . Asthma   . Endometriosis   . Headache(784.0)   . Hives   . Ovarian cyst   . Scoliosis     Past Surgical History: Past Surgical History:  Procedure Laterality Date  . OVARIAN CYST REMOVAL    . TONSILLECTOMY    . WRIST SURGERY      Family History:  Family History  Problem Relation Age of Onset  . Anxiety disorder Mother   . Bipolar disorder Sister   . Colon  cancer Paternal Grandfather [id number]   She denies any female cancers, bleeding or blood clotting disorders.  She denies any history of mental retardation, birth defects or genetic disorders in her or the FOB's history  Social History:  Social History   Socioeconomic History  . Marital status: Single    Spouse name: Not on file  . Number of children: Not on file  . Years of education: Not on file  . Highest education level: Not on file  Social Needs  . Financial resource strain: Not on file  . Food insecurity - worry: Not on file  . Food insecurity - inability: Not on file  . Transportation needs - medical: Not on file  . Transportation needs - non-medical: Not on file  Occupational History  . 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 Smoking status: Current Every Day Smoker    Packs/day: 1.00    Types: Cigarettes    Last attempt to quit: 12/16/2015    Years since quitting: 1.6  . Smokeless tobacco: Never Used  Substance and Sexual Activity  . Alcohol use: No  Comment: Hx of alcohol use no recent use  . Drug use: No  . Sexual activity: Yes    Partners: Male    Birth control/protection: None  Other Topics Concern  . Not on file  Social History Narrative   ** Merged History Encounter **       Any pets in the household: no Discussed risks of changing cat litter and toxoplasmosis  Allergy: Allergies  Allergen Reactions  . Aspirin Other (See Comments)    Syncope  LOC  . Asa [Aspirin] Hives    Current Outpatient Medications:  Current Outpatient Medications:  .  Ferrous Sulfate (SLOW FE) 142 (45 Fe) MG TBCR, Take 1 capsule by mouth daily., Disp: 30 tablet, Rfl:  .  Prenatal Multivit-Min-Fe-FA (PRENATAL VITAMINS) 0.8 MG tablet, Take 1 tablet by mouth daily., Disp: 90 tablet, Rfl: 3 .  desoximetasone (TOPICORT) 0.25 % cream, Apply 1 application topically 2 (two) times daily., Disp: 30 g, Rfl: 1 .  metroNIDAZOLE (FLAGYL) 500  MG tablet, Take 1 tablet (500 mg total) by mouth 2 (two) times daily for 7 days. (Patient not taking: Reported on 08/01/2017), Disp: 14 tablet, Rfl: 0   Physical Exam:   BP (!) 102/58   Wt 128 lb (58.1 kg)   LMP 02/16/2017 (LMP Unknown)   BMI 20.05 kg/m  Body mass index is 20.05 kg/m. Constitutional: Well nourished, well developed female in no acute distress.  Neck:  Supple, normal appearance, and no thyromegaly  Cardiovascular: S1, S2 normal, no murmur, rub or gallop, regular rate and rhythm Respiratory:  Clear to auscultation bilateral. Normal respiratory effort Abdomen: positive bowel sounds and no masses, hernias; diffusely non tender to palpation, non distended Breasts: breasts appear normal, no suspicious masses, no skin or nipple changes or axillary nodes. Neuro/Psych:  Normal mood and affect.  Skin:  Warm and dry.  Lymphatic:  No inguinal lymphadenopathy.   Pelvic exam: is not limited by body habitus EGBUS: within normal limits, Vagina: within normal limits and with no blood in the vault, Cervix: normal appearing cervix without discharge or lesions, closed/long/high, Uterus:  enlarged: gravid, and Adnexa:  normal adnexa  Assessment: Peggy Chandler is a 22 y.o. U2V2536 [redacted]w[redacted]d based on Patient's last menstrual period was 02/16/2017 (lmp unknown). with an Estimated Date of Delivery: 11/23/17,  for prenatal care.  Plan:  1) Avoid alcoholic beverages. 2) Patient encouraged not to smoke.  3) Discontinue the use of all non-medicinal drugs and chemicals.  4) Take prenatal vitamins daily.  5) Seatbelt use advised 6) Nutrition, food safety (fish, cheese advisories, and high nitrite foods) and exercise discussed. 7) Hospital and practice style delivering at [facility] discussed  8) Patient is asked about travel to areas at risk for the Zika virus, and counseled to avoid travel and exposure to mosquitoes or sexual partners who may have themselves been exposed to the virus. Testing is discussed, and  will be ordered as appropriate.  9) Childbirth classes at [facility] advised 10) Genetic Screening, such as with 1st Trimester Screening, cell free fetal DNA, AFP testing, and Ultrasound, as well as with amniocentesis and CV[facility] as appropriate, is discussed with patient. She plans to have genetic testing this pregnancy.   Problem list reviewed and updated. Health department records reviewed.   Adelene Idler, MD [facility], [facility] 08/01/2017  2:38 PM

## 2017-08-15 ENCOUNTER — Encounter: Payer: Medicaid Other | Admitting: Obstetrics and Gynecology

## 2017-08-17 DIAGNOSIS — O99013 Anemia complicating pregnancy, third trimester: Secondary | ICD-10-CM | POA: Insufficient documentation

## 2017-08-22 ENCOUNTER — Ambulatory Visit (INDEPENDENT_AMBULATORY_CARE_PROVIDER_SITE_OTHER): Payer: Medicaid Other | Admitting: Obstetrics & Gynecology

## 2017-08-22 VITALS — BP 130/80 | Wt 131.0 lb

## 2017-08-22 DIAGNOSIS — Z3A26 26 weeks gestation of pregnancy: Secondary | ICD-10-CM

## 2017-08-22 DIAGNOSIS — N3000 Acute cystitis without hematuria: Secondary | ICD-10-CM | POA: Diagnosis not present

## 2017-08-22 DIAGNOSIS — Z3480 Encounter for supervision of other normal pregnancy, unspecified trimester: Secondary | ICD-10-CM

## 2017-08-22 LAB — POCT URINALYSIS DIPSTICK
Bilirubin, UA: NEGATIVE
Blood, UA: NEGATIVE
GLUCOSE UA: NEGATIVE
KETONES UA: NEGATIVE
NITRITE UA: NEGATIVE
Protein, UA: NEGATIVE
SPEC GRAV UA: 1.02 (ref 1.010–1.025)
Urobilinogen, UA: 1 E.U./dL
pH, UA: 7 (ref 5.0–8.0)

## 2017-08-22 MED ORDER — NITROFURANTOIN MONOHYD MACRO 100 MG PO CAPS: 100.0000 mg | ORAL_CAPSULE | Freq: Two times a day (BID) | ORAL | 0 refills | Status: DC

## 2017-08-22 MED ORDER — FLUCONAZOLE 150 MG PO TABS: 150.0000 mg | ORAL_TABLET | Freq: Once | ORAL | 0 refills | Status: AC

## 2017-08-22 NOTE — Addendum Note (Signed)
Addended by: Cornelius MorasPATTERSON, Emori Mumme D on: 08/22/2017 04:10 PM   Modules accepted: Orders

## 2017-08-22 NOTE — Patient Instructions (Signed)

## 2017-08-22 NOTE — Progress Notes (Signed)
  Subjective  Fetal Movement? yes Contractions? no Leaking Fluid? Yes- discharge w itching Vaginal Bleeding? no Recent Incarceration.  [facility] Objective  BP 130/80   Wt 131 lb (59.4 kg)   LMP 02/16/2017 (LMP Unknown)   BMI 20.52 kg/m  General: NAD Pumonary: no increased work of breathing Abdomen: gravid, non-tender Extremities: no edema Psychiatric: mood appropriate, affect full  Assessment  22 y.o. G3P1011 at 2840w5d by  11/23/2017, by Last Menstrual Period presenting for routine prenatal visit  Plan   Problem List Items Addressed This Visit      Genitourinary   Urinary tract infection   Relevant Medications   nitrofurantoin, macrocrystal-monohydrate, (MACROBID) 100 MG capsule   fluconazole (DIFLUCAN) 150 MG tablet     Other   Supervision of other normal pregnancy, antepartum    Other Visit Diagnoses    [redacted] weeks gestation of pregnancy    -  Primary   Relevant Orders   RPR+Rh+ABO+Rub Ab+Ab Scr+CB...   Drug Screen, Urine    Declines flu  Annamarie MajorPaul Cristan Hout, MD, Merlinda FrederickFACOG [facility], [facility] 08/22/2017  3:53 PM

## 2017-08-23 LAB — RPR+RH+ABO+RUB AB+AB SCR+CB...
Antibody Screen: NEGATIVE
HEMATOCRIT: 30.6 % — AB (ref 34.0–46.6)
HEMOGLOBIN: 9.6 g/dL — AB (ref 11.1–15.9)
HIV Screen 4th Generation wRfx: NONREACTIVE
Hepatitis B Surface Ag: NEGATIVE
MCH: 27.4 pg (ref 26.6–33.0)
MCHC: 31.4 g/dL — ABNORMAL LOW (ref 31.5–35.7)
MCV: 87 fL (ref 79–97)
Platelets: 172 10*3/uL (ref 150–379)
RBC: 3.51 x10E6/uL — AB (ref 3.77–5.28)
RDW: 15.3 % (ref 12.3–15.4)
RH TYPE: NEGATIVE
RPR Ser Ql: NONREACTIVE
RUBELLA: 4.56 {index} (ref >0.99)
VARICELLA: 511 {index} (ref >165)
WBC: 10.1 10*3/uL (ref 3.4–10.8)

## 2017-08-23 LAB — DRUG SCREEN, URINE
Amphetamines, Urine: NEGATIVE ng/mL
Barbiturate screen, urine: NEGATIVE ng/mL
Benzodiazepine Quant, Ur: NEGATIVE ng/mL
CANNABINOID QUANT UR: NEGATIVE ng/mL
COCAINE (METAB.): NEGATIVE ng/mL
Opiate Quant, Ur: NEGATIVE ng/mL
PCP QUANT UR: NEGATIVE ng/mL

## 2017-09-05 ENCOUNTER — Other Ambulatory Visit: Payer: Medicaid Other

## 2017-09-05 ENCOUNTER — Encounter: Payer: Medicaid Other | Admitting: Obstetrics & Gynecology

## 2017-09-09 ENCOUNTER — Ambulatory Visit (INDEPENDENT_AMBULATORY_CARE_PROVIDER_SITE_OTHER): Payer: Medicaid Other | Admitting: Obstetrics & Gynecology

## 2017-09-09 ENCOUNTER — Other Ambulatory Visit: Payer: Medicaid Other

## 2017-09-09 VITALS — BP 120/80 | Wt 135.0 lb

## 2017-09-09 DIAGNOSIS — Z3480 Encounter for supervision of other normal pregnancy, unspecified trimester: Secondary | ICD-10-CM

## 2017-09-09 DIAGNOSIS — Z3A29 29 weeks gestation of pregnancy: Secondary | ICD-10-CM | POA: Diagnosis not present

## 2017-09-09 MED ORDER — FERROUS SULFATE 325 (65 FE) MG PO TABS
325.0000 mg | ORAL_TABLET | Freq: Two times a day (BID) | ORAL | 1 refills | Status: AC
Start: 2017-09-09 — End: ?

## 2017-09-09 NOTE — Progress Notes (Signed)
  Subjective  Fetal Movement? yes Contractions? no Leaking Fluid? no Vaginal Bleeding? no LBP at times Pt living at jail at this time, feels safe; just uncomfortable living conditions Objective  BP 120/80   Wt 135 lb (61.2 kg)   LMP 02/16/2017 (LMP Unknown)   BMI 21.14 kg/m  General: NAD Pumonary: no increased work of breathing Abdomen: gravid, non-tender Extremities: no edema Psychiatric: mood appropriate, affect full  Assessment  22 y.o. G3P1011 at 5470w2d by  11/23/2017, by Last Menstrual Period presenting for routine prenatal visit  Plan   Problem List Items Addressed This Visit      Other   Supervision of other normal pregnancy, antepartum    Other Visit Diagnoses    [redacted] weeks gestation of pregnancy    -  Primary   Relevant Orders   28 Weeks RH-Panel    Fe in addition to PNV prescribed PTL precautions  Annamarie MajorPaul Edward Trevino, MD, Merlinda FrederickFACOG [facility], [facility] 09/09/2017  10:54 AM

## 2017-09-09 NOTE — Addendum Note (Signed)
Addended by: Cornelius MorasPATTERSON, Marquist Binstock D on: 09/09/2017 11:25 AM   Modules accepted: Orders

## 2017-09-10 LAB — 28 WEEKS RH-PANEL
ANTIBODY SCREEN: NEGATIVE
BASOS ABS: 0 10*3/uL (ref 0.0–0.2)
Basos: 0 %
EOS (ABSOLUTE): 0.1 10*3/uL (ref 0.0–0.4)
Eos: 1 %
GESTATIONAL DIABETES SCREEN: 86 mg/dL (ref 65–139)
HEMOGLOBIN: 8.7 g/dL — AB (ref 11.1–15.9)
HIV Screen 4th Generation wRfx: NONREACTIVE
Hematocrit: 27.9 % — ABNORMAL LOW (ref 34.0–46.6)
IMMATURE GRANULOCYTES: 1 %
Immature Grans (Abs): 0.1 10*3/uL (ref 0.0–0.1)
LYMPHS: 21 %
Lymphocytes Absolute: 2 10*3/uL (ref 0.7–3.1)
MCH: 27.4 pg (ref 26.6–33.0)
MCHC: 31.2 g/dL — ABNORMAL LOW (ref 31.5–35.7)
MCV: 88 fL (ref 79–97)
MONOCYTES: 7 %
Monocytes Absolute: 0.7 10*3/uL (ref 0.1–0.9)
NEUTROS PCT: 70 %
Neutrophils Absolute: 6.5 10*3/uL (ref 1.4–7.0)
PLATELETS: 141 10*3/uL — AB (ref 150–379)
RBC: 3.17 x10E6/uL — ABNORMAL LOW (ref 3.77–5.28)
RDW: 15.3 % (ref 12.3–15.4)
RPR: NONREACTIVE
WBC: 9.3 10*3/uL (ref 3.4–10.8)

## 2017-09-26 ENCOUNTER — Encounter: Payer: Medicaid Other | Admitting: Obstetrics and Gynecology

## 2017-09-29 ENCOUNTER — Encounter: Payer: Medicaid Other | Admitting: Obstetrics & Gynecology

## 2017-09-29 ENCOUNTER — Encounter: Payer: Medicaid Other | Admitting: Advanced Practice Midwife

## 2017-10-07 ENCOUNTER — Encounter: Payer: Self-pay | Admitting: Advanced Practice Midwife

## 2017-10-07 ENCOUNTER — Ambulatory Visit (INDEPENDENT_AMBULATORY_CARE_PROVIDER_SITE_OTHER): Payer: Medicaid Other | Admitting: Advanced Practice Midwife

## 2017-10-07 ENCOUNTER — Telehealth: Payer: Self-pay | Admitting: Advanced Practice Midwife

## 2017-10-07 VITALS — BP 98/58 | HR 89 | Wt 129.0 lb

## 2017-10-07 DIAGNOSIS — Z3A33 33 weeks gestation of pregnancy: Secondary | ICD-10-CM

## 2017-10-07 NOTE — Telephone Encounter (Signed)
Patient's Ger[facility] Friend[facility] is calling to follow up about an note. Please contact.

## 2017-10-07 NOTE — Progress Notes (Signed)
Pt c/o severe panic attacks 3-4 times per day and reports same issues during last pregnancy. Pt also c/o crying spells and increased depression due to incarceration and decreased appetite. Concerned about weight loss and frequent braxton hicks ctx. Also c/o agoraphobia and not feeling like she is getting enough medical care at facility. Also c/o headaches and not sleeping well. Has been trying to cope with anxiety but having difficulty and also worried about taking any medication.

## 2017-10-10 ENCOUNTER — Encounter: Payer: Self-pay | Admitting: Advanced Practice Midwife

## 2017-10-10 DIAGNOSIS — R630 Anorexia: Secondary | ICD-10-CM | POA: Insufficient documentation

## 2017-10-10 NOTE — Progress Notes (Signed)
Routine Prenatal Care Visit  Subjective  Peggy Chandler is a 22 y.o. G3P1011 at [redacted]w[redacted]d being seen today for ongoing prenatal care.  She is currently monitored for the following issues for this high-risk pregnancy and has Severe major depression, single episode (HCC); Generalized anxiety disorder; Family history of autism; Bacterial vaginosis; Supervision of other normal pregnancy, antepartum; PUPP (pruritic urticarial papules and plaques of pregnancy); Anemia; Underweight; Anxiety in pregnancy in third trimester, antepartum; Panic disorder with agoraphobia; and Loss of appetite on their problem list.  ----------------------------------------------------------------------------------- Patient reports headache, anxiety, panic attacks, not sleeping well, increased depression. Patient is very concerned about her anxiety level, lack of sleep, not eating well, weight loss. Her lawyer has told her if her provider writes a letter stating these concerns and if there are fetal concerns that there is a possibility she may be allowed to be at home under house arrest for the remainder of the pregnancy. She is requesting a letter from me today. Discussed possibility of taking medication to help her with the anxiety and depression. She is afraid to take medicine and states if she can be home with her support system that she will do better. I spoke with her lawyer after clinic hours and he reiterated the same. He will bring the letter to the judge who would make the decision. I also spoke with the prison nurse. She said there is a plan in place to move the patient in the next few weeks to the [facility] where inmates receive maternity care/deliver. Letter written and the lawyer will pick up on Monday.   Contractions: Not present. Vag. Bleeding: None.  Movement: Present. Denies leaking of fluid.  ----------------------------------------------------------------------------------- The following  portions of the patient's history were reviewed and updated as appropriate: allergies, current medications, past family history, past medical history, past social history, past surgical history and problem list. Problem list updated.   Objective  Blood pressure (!) 98/58, pulse 89, weight 129 lb (58.5 kg), last menstrual period 02/16/2017 Pregravid weight 117 lb (53.1 kg) Total Weight Gain 12 lb (5.443 kg) Urinalysis: Urine Protein: Negative Urine Glucose: Negative  Fetal Status: Fetal Heart Rate (bpm): 135 Fundal Height: 32 cm Movement: Present     General:  Alert, oriented and cooperative. Patient is in no acute distress.  Skin: Skin is warm and dry. No rash noted.   Cardiovascular: Normal heart rate noted  Respiratory: Normal respiratory effort, no problems with respiration noted  Abdomen: Soft, gravid, appropriate for gestational age. Pain/Pressure: Absent     Pelvic:  Cervical exam deferred        Extremities: Normal range of motion.  Edema: Trace  Mental Status: Normal mood and affect. Normal behavior. Normal judgment and thought content.   Assessment   22 y.o. G3P1011 at [redacted]w[redacted]d by  11/23/2017, by Last Menstrual Period presenting for routine prenatal visit  Plan   pregnancy Problems (from 05/12/17 to present)    Problem Noted Resolved   Supervision of other normal pregnancy, antepartum 08/01/2017 by Natale Milch, MD No   Overview Signed 08/01/2017  2:36 PM by Natale Milch, MD      [facility] Prenatal Labs  Dating  Blood type:     Genetic Screen 1 Screen:     AFP:      Quad:      NIPS:    Antibody:   Anatomic [place]  Rubella:   Varicella: @VZVIGG @  GTT Early:        28 wk:  RPR:     Rhogam  HBsAg:     TDaP vaccine                       HIV:    Flu Shot    Ceclined                           GBS:   Contraception  Copper IUD Pap: (Not done, under 21)  CBB   Given Information   CS/VBAC    Baby Food    Support Person             PUPP (pruritic  urticarial papules and plaques of pregnancy) 08/01/2017 by Natale MilchSchuman, Christanna R, MD No       Preterm labor symptoms and general obstetric precautions including but not limited to vaginal bleeding, contractions, leaking of fluid and fetal movement were reviewed in detail with the patient.   Return in about 2 weeks (around 10/21/2017) for rob.  Tresea MallJane Ryanne Morand, CNM 10/10/2017 1:16 PM

## 2017-10-20 ENCOUNTER — Ambulatory Visit (INDEPENDENT_AMBULATORY_CARE_PROVIDER_SITE_OTHER): Payer: Medicaid Other | Admitting: Obstetrics and Gynecology

## 2017-10-20 ENCOUNTER — Encounter: Payer: Self-pay | Admitting: Obstetrics and Gynecology

## 2017-10-20 VITALS — BP 104/64 | Wt 132.0 lb

## 2017-10-20 DIAGNOSIS — O99343 Other mental disorders complicating pregnancy, third trimester: Secondary | ICD-10-CM

## 2017-10-20 DIAGNOSIS — Z3A35 35 weeks gestation of pregnancy: Secondary | ICD-10-CM

## 2017-10-20 DIAGNOSIS — F419 Anxiety disorder, unspecified: Secondary | ICD-10-CM

## 2017-10-20 DIAGNOSIS — O26843 Uterine size-date discrepancy, third trimester: Secondary | ICD-10-CM

## 2017-10-20 DIAGNOSIS — F322 Major depressive disorder, single episode, severe without psychotic features: Secondary | ICD-10-CM

## 2017-10-20 DIAGNOSIS — Z3480 Encounter for supervision of other normal pregnancy, unspecified trimester: Secondary | ICD-10-CM

## 2017-10-20 NOTE — Progress Notes (Incomplete)
Routine Prenatal Care Visit  Subjective  Peggy Chandler is a 22 y.o. G3P1011 at [redacted]w[redacted]d being seen today for ongoing prenatal care.  She is currently monitored for the following issues for this {Blank single:19197::"high-risk","low-risk"} pregnancy and has Severe major depression, single episode (HCC); Generalized anxiety disorder; Family history of autism; Bacterial vaginosis; Supervision of other normal pregnancy, antepartum; PUPP (pruritic urticarial papules and plaques of pregnancy); Anemia affecting pregnancy in third trimester; Underweight; Anxiety in pregnancy in third trimester, antepartum; Panic disorder with agoraphobia; Loss of appetite; and Size of fetus inconsistent with dates in third trimester on their problem list.  ----------------------------------------------------------------------------------- Patient reports {sx:14538}.   Contractions: Not present. Vag. Bleeding: None.  Movement: Present. Denies leaking of fluid.  ----------------------------------------------------------------------------------- The following portions of the patient's history were reviewed and updated as appropriate: allergies, current medications, past family history, past medical history, past social history, past surgical history and problem list. Problem list updated.   Objective  Blood pressure 104/64, weight 132 lb (59.9 kg), last menstrual period 02/16/2017, unknown if currently breastfeeding. Pregravid weight 117 lb (53.1 kg) Total Weight Gain 15 lb (6.804 kg) Urinalysis: Urine Protein: Negative Urine Glucose: Negative  Fetal Status: Fetal Heart Rate (bpm): 135 Fundal Height: 31 cm Movement: Present     General:  Alert, oriented and cooperative. Patient is in no acute distress.  Skin: Skin is warm and dry. No rash noted.   Cardiovascular: Normal heart rate noted  Respiratory: Normal respiratory effort, no problems with respiration noted  Abdomen: Soft, gravid, appropriate for  gestational age. Pain/Pressure: Absent     Pelvic:  {Blank single:19197::"Cervical exam performed","Cervical exam deferred"}        Extremities: Normal range of motion.  Edema: None  Mental Status: Normal mood and affect. Normal behavior. Normal judgment and thought content.   Assessment   22 y.o. G3P1011 at [redacted]w[redacted]d by  11/23/2017, by Last Menstrual Period presenting for {Blank single:19197::"routine","work-in"} prenatal visit  Plan   pregnancy Problems (from 05/12/17 to present)    Problem Noted Resolved   Size of fetus inconsistent with dates in third trimester 10/20/2017 by Conard Novak, MD No   Anemia affecting pregnancy in third trimester 08/17/2017 by Natale Milch, MD No   Supervision of other normal pregnancy, antepartum 08/01/2017 by Natale Milch, MD No   Overview Signed 08/01/2017  2:36 PM by Natale Milch, MD      [facility] Prenatal Labs  Dating  Blood type:     Genetic Screen 1 Screen:     AFP:      Quad:      NIPS:    Antibody:   Anatomic [place]  Rubella:   Varicella: @VZVIGG @  GTT Early:        28 wk:      RPR:     Rhogam  HBsAg:     TDaP vaccine                       HIV:    Flu Shot    Ceclined                           GBS:   Contraception  Copper IUD Pap: (Not done, under 21)  CBB   Given Information   CS/VBAC    Baby Food    Support Person             PUPP (pruritic urticarial papules and plaques  of pregnancy) 08/01/2017 by Natale MilchSchuman, Christanna R, MD No       {Blank single:19197::"Term","Preterm"} labor symptoms and general obstetric precautions including but not limited to vaginal bleeding, contractions, leaking of fluid and fetal movement were reviewed in detail with the patient. Please refer to After Visit Summary for other counseling recommendations.   Return in about 1 week (around 10/27/2017) for schedule u/s for growth and Routine Prenatal Appointment after.  Thomasene MohairStephen Jackson, MD, Merlinda FrederickFACOG [facility], [facility] 10/20/2017 12:11 PM  No vb. No lof.

## 2017-10-20 NOTE — Progress Notes (Signed)
Routine Prenatal Care Visit  Subjective  Peggy Chandler is a 22 y.o. G3P1011 at 6759w1d being seen today for ongoing prenatal care.  She is currently monitored for the following issues for this low-risk pregnancy and has Severe major depression, single episode (HCC); Generalized anxiety disorder; Family history of autism; Bacterial vaginosis; Supervision of other normal pregnancy, antepartum; PUPP (pruritic urticarial papules and plaques of pregnancy); Anemia affecting pregnancy in third trimester; Underweight; Anxiety in pregnancy in third trimester, antepartum; Panic disorder with agoraphobia; Loss of appetite; and Size of fetus inconsistent with dates in third trimester on their problem list.  ----------------------------------------------------------------------------------- Patient reports no complaints.   Contractions: Not present. Vag. Bleeding: None.  Movement: Present. Denies leaking of fluid.  ----------------------------------------------------------------------------------- The following portions of the patient's history were reviewed and updated as appropriate: allergies, current medications, past family history, past medical history, past social history, past surgical history and problem list. Problem list updated.  Objective  Blood pressure 104/64, weight 132 lb (59.9 kg), last menstrual period 02/16/2017, unknown if currently breastfeeding. Pregravid weight 117 lb (53.1 kg) Total Weight Gain 15 lb (6.804 kg) Urinalysis: Urine Protein: Negative Urine Glucose: Negative  Fetal Status: Fetal Heart Rate (bpm): 135 Fundal Height: 31 cm Movement: Present     General:  Alert, oriented and cooperative. Patient is in no acute distress.  Skin: Skin is warm and dry. No rash noted.   Cardiovascular: Normal heart rate noted  Respiratory: Normal respiratory effort, no problems with respiration noted  Abdomen: Soft, gravid, appropriate for gestational age. Pain/Pressure: Absent      Pelvic:  Cervical exam deferred        Extremities: Normal range of motion.  Edema: None  Mental Status: Normal mood and affect. Normal behavior. Normal judgment and thought content.   Assessment   22 y.o. G3P1011 at 6159w1d by  11/23/2017, by Last Menstrual Period presenting for routine prenatal visit  Plan   pregnancy Problems (from 05/12/17 to present)    Problem Noted Resolved   Size of fetus inconsistent with dates in third trimester 10/20/2017 by Conard NovakJackson, Peggy Plant D, MD No   Anemia affecting pregnancy in third trimester 08/17/2017 by Natale MilchSchuman, Peggy R, MD No   Supervision of other normal pregnancy, antepartum 08/01/2017 by Natale MilchSchuman, Peggy R, MD No   Overview Signed 08/01/2017  2:36 PM by Natale MilchSchuman, Peggy R, MD      [facility] Prenatal Labs  Dating  Blood type:     Genetic Screen 1 Screen:     AFP:      Quad:      NIPS:    Antibody:   Anatomic US  Rubella:   Varicella: @VZVIGG @  GTT Early:        28 wk:      RPR:     Rhogam  HBsAg:     TDaP vaccine                       HIV:    Flu Shot    Ceclined                           GBS:   Contraception  Copper IUD Pap: (Not done, under 21)  CBB   Given Information   CS/VBAC    Baby Food    Support Person             PUPP (pruritic urticarial papules and plaques of pregnancy) 08/01/2017 by Jerene PitchSchuman,  Peggy R, MD No       Preterm labor symptoms and general obstetric precautions including but not limited to vaginal bleeding, contractions, leaking of fluid and fetal movement were reviewed in detail with the patient. Please refer to After Visit Summary for other counseling recommendations.   Uterine size measures less than expected for gestational age.  Will obtain growth u/s at next visit in one week.  Discussed possible causes with patient.  This includes poor nutritional status versus placental issue.   Return in about 1 week (around 10/27/2017) for schedule u/s for growth and Routine Prenatal Appointment  after.  Thomasene Mohair, MD, [facility], [facility] 10/20/2017 12:11 PM

## 2017-12-26 ENCOUNTER — Encounter: Payer: Self-pay | Admitting: Maternal Newborn

## 2017-12-26 ENCOUNTER — Ambulatory Visit (INDEPENDENT_AMBULATORY_CARE_PROVIDER_SITE_OTHER): Payer: Medicaid Other | Admitting: Maternal Newborn

## 2017-12-26 ENCOUNTER — Ambulatory Visit: Payer: Medicaid Other | Admitting: Obstetrics and Gynecology

## 2017-12-26 DIAGNOSIS — Z124 Encounter for screening for malignant neoplasm of cervix: Secondary | ICD-10-CM

## 2017-12-26 NOTE — Progress Notes (Signed)
Postpartum Visit   History of Present Illness: Patient is a 22 y.o. [id number] presenting for a postpartum visit.  Date of delivery: 11/16/2017 Type of delivery: Vaginal delivery - Vacuum or forceps assisted  no Episiotomy No.  Laceration: no  Pregnancy or labor problems:  Yes, IUGR, IOL at 39 weeks at [facility] Any problems since the delivery:  no  Newborn Details:  SINGLETON  Gender: Female   Maternal Details:  Breast Feeding:  no Post partum depression/anxiety noted:  yes Edinburgh Post-Partum Depression Score:  15  GAD 7: 15 Date of last PAP: None  Review of Systems  Constitutional: Positive for malaise/fatigue.  HENT: Negative.   Eyes: Negative.   Respiratory: Negative for cough, shortness of breath and wheezing.   Cardiovascular: Negative for chest pain and palpitations.  Gastrointestinal: Negative for abdominal pain, constipation, diarrhea, heartburn and nausea.  Genitourinary: Negative.   Musculoskeletal: Positive for myalgias.  Skin: Negative.   Neurological: Negative.   Endo/Heme/Allergies: Negative.   Psychiatric/Behavioral: Positive for depression. Negative for suicidal ideas. The patient is nervous/anxious.   All other systems reviewed and are negative.   Past Medical History:  Past Medical History:  Diagnosis Date  . Anemia   . Anxiety   . Arthritis   . Asthma   . Endometriosis   . Headache(784.0)   . Hives   . Ovarian cyst   . Scoliosis     Past Surgical History:  Past Surgical History:  Procedure Laterality Date  . OVARIAN CYST REMOVAL    . TONSILLECTOMY    . WRIST SURGERY      Family History:  Family History  Problem Relation Age of Onset  . Anxiety disorder Mother   . Bipolar disorder Sister   . Colon cancer Paternal Grandfather 69    Social History:  Social History   Socioeconomic History  . Marital status: Single    Spouse name: Not on file  . Number of children: Not on file  . Years of education: Not on file  .  Highest education level: Not on file  Occupational History  . 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 Financial resource strain: Not on file  . Food insecurity:    Worry: Not on file    Inability: Not on file  . Transportation needs:    Medical: Not on file    Non-medical: Not on file  Tobacco Use  . Smoking status: Former Smoker    Packs/day: 1.00    Types: Cigarettes    Last attempt to quit: 12/16/2015    Years since quitting: 2.0  . Smokeless tobacco: Never Used  Substance and Sexual Activity  . Alcohol use: No    Comment: Hx of alcohol use no recent use  . Drug use: No  . Sexual activity: Not Currently    Partners: Male    Birth control/protection: None  Lifestyle  . Physical activity:    Days per week: Not on file    Minutes per session: Not on file  . Stress: Not on file  Relationships  . Social connections:    Talks on phone: Not on file    Gets together: Not on file    Attends religious service: Not on file    Active member of club or organization: Not on file    Attends meetings of clubs or organizations: Not on file    Relationship status: Not on file  . Intimate  partner violence:    Fear of current or ex partner: Not on file    Emotionally abused: Not on file    Physically abused: Not on file    Forced sexual activity: Not on file  Other Topics Concern  . Not on file  Social History Narrative   ** Merged History Encounter **        Allergies:  Allergies  Allergen Reactions  . Aspirin Other (See Comments)    Syncope  LOC Mom states pt passed out, age 22.  Jonne Ply. Asa [Aspirin] Hives    Medications: Prior to Admission medications   Medication Sig Start Date End Date Taking? Authorizing Provider  ferrous sulfate (FERROUSUL) 325 (65 FE) MG tablet Take 1 tablet (325 mg total) by mouth 2 (two) times daily. 09/09/17   Nadara MustardHarris, Robert P, MD  Prenatal Multivit-Min-Fe-FA (PRENATAL VITAMINS) 0.8 MG tablet Take 1  tablet by mouth daily. 06/14/15   Sharman CheekStafford, Phillip, MD    Physical Exam Vitals:  Vitals:   12/26/17 0953  BP: 110/80  Pulse: 74    General: NAD HEENT: normocephalic, anicteric Pulmonary: No increased work of breathing, CTAB Breasts: breasts appear normal, no suspicious masses, no skin or nipple changes or axillary nodes, right breast normal without mass, skin or nipple changes or axillary nodes, left breast normal without mass, skin or nipple changes or axillary nodes. Abdomen: soft, non-tender, non-distended.  Umbilicus without lesions.  No hepatomegaly, splenomegaly or masses palpable. No evidence of hernia.  Genitourinary:  External: Normal external female genitalia.  Normal  urethral meatus, normal Bartholin's and Skene's glands.    Vagina: Normal vaginal mucosa, no evidence of prolapse.    Cervix: Grossly normal in appearance, bleeding  consistent with menses present  Uterus: Non-enlarged, mobile, normal contour.  No CMT  Adnexa: ovaries non-enlarged, no adnexal masses  Rectal: deferred Extremities: no edema, erythema, or tenderness Neurologic: Grossly intact Psychiatric: mood appropriate, affect full  Assessment: 22 y.o. G3P1011 presenting for 6 week postpartum visit screening positive for postpartum anxiety and depression.  Plan: Problem List Items Addressed This Visit    None    Visit Diagnoses    Postpartum care and examination    -  Primary   Pap smear for cervical cancer screening       Relevant Orders   Pap IG w/ reflex to HPV when [facility]     1) Contraception - will address when released from incarceration.  2)  Pap - done today. - ASCCP guidelines and rationale discussed.  Patient opts for every 3 year screening interval.  3) Patient underwent screening for postpartum depression with concerns noted for both anxiety and depression. Recommend starting Zoloft 50 mg PO daily and sent written order with patient for medication.  4) Follow up 1 year for routine  annual exam.  Marcelyn BruinsJacelyn Nickalous Stingley, CNM 12/26/2017  10:24 AM

## 2017-12-28 LAB — PAP IG W/ RFLX HPV ASCU: PAP Smear Comment: 0

## 2018-02-15 ENCOUNTER — Encounter (HOSPITAL_COMMUNITY): Payer: Self-pay
